# Patient Record
Sex: Female | Born: 1940 | Race: White | Hispanic: No | Smoking: Never smoker
Health system: Southern US, Community
[De-identification: ages and names within clinical notes are randomized; demographics above are authoritative.]

## PROBLEM LIST (undated history)

## (undated) DIAGNOSIS — E785 Hyperlipidemia, unspecified: Secondary | ICD-10-CM

## (undated) DIAGNOSIS — M797 Fibromyalgia: Secondary | ICD-10-CM

## (undated) DIAGNOSIS — M869 Osteomyelitis, unspecified: Secondary | ICD-10-CM

## (undated) DIAGNOSIS — I10 Essential (primary) hypertension: Secondary | ICD-10-CM

## (undated) DIAGNOSIS — M1990 Unspecified osteoarthritis, unspecified site: Secondary | ICD-10-CM

## (undated) DIAGNOSIS — K589 Irritable bowel syndrome without diarrhea: Secondary | ICD-10-CM

## (undated) DIAGNOSIS — M879 Osteonecrosis, unspecified: Secondary | ICD-10-CM

## (undated) DIAGNOSIS — M8580 Other specified disorders of bone density and structure, unspecified site: Secondary | ICD-10-CM

## (undated) HISTORY — DX: Osteomyelitis, unspecified: M86.9

## (undated) HISTORY — DX: Osteonecrosis, unspecified: M87.9

## (undated) HISTORY — DX: Other specified disorders of bone density and structure, unspecified site: M85.80

## (undated) HISTORY — DX: Unspecified osteoarthritis, unspecified site: M19.90

## (undated) HISTORY — DX: Essential (primary) hypertension: I10

## (undated) HISTORY — DX: Fibromyalgia: M79.7

## (undated) HISTORY — DX: Irritable bowel syndrome, unspecified: K58.9

## (undated) HISTORY — DX: Hyperlipidemia, unspecified: E78.5

---

## 1976-02-11 HISTORY — PX: TOTAL ABDOMINAL HYSTERECTOMY: SHX209

## 1977-02-10 HISTORY — PX: BILATERAL SALPINGOOPHORECTOMY: SHX1223

## 1987-02-11 HISTORY — PX: CARPAL TUNNEL RELEASE: SHX101

## 1998-11-21 ENCOUNTER — Other Ambulatory Visit: Admission: RE | Admit: 1998-11-21 | Discharge: 1998-11-21 | Payer: Self-pay | Admitting: *Deleted

## 2001-09-27 ENCOUNTER — Other Ambulatory Visit: Admission: RE | Admit: 2001-09-27 | Discharge: 2001-09-27 | Payer: Self-pay | Admitting: *Deleted

## 2002-08-07 ENCOUNTER — Encounter: Payer: Self-pay | Admitting: Internal Medicine

## 2002-08-07 ENCOUNTER — Encounter: Admission: RE | Admit: 2002-08-07 | Discharge: 2002-08-07 | Payer: Self-pay | Admitting: Internal Medicine

## 2003-12-27 ENCOUNTER — Ambulatory Visit: Payer: Self-pay | Admitting: Internal Medicine

## 2004-01-22 ENCOUNTER — Ambulatory Visit: Payer: Self-pay | Admitting: Family Medicine

## 2004-01-31 ENCOUNTER — Ambulatory Visit: Payer: Self-pay | Admitting: Internal Medicine

## 2004-04-02 ENCOUNTER — Ambulatory Visit: Payer: Self-pay | Admitting: Internal Medicine

## 2004-05-14 ENCOUNTER — Ambulatory Visit: Payer: Self-pay | Admitting: Internal Medicine

## 2004-06-13 ENCOUNTER — Ambulatory Visit: Payer: Self-pay | Admitting: Internal Medicine

## 2004-07-02 ENCOUNTER — Ambulatory Visit: Payer: Self-pay | Admitting: Internal Medicine

## 2004-08-20 ENCOUNTER — Ambulatory Visit: Payer: Self-pay | Admitting: Internal Medicine

## 2004-10-15 ENCOUNTER — Ambulatory Visit: Payer: Self-pay | Admitting: Internal Medicine

## 2004-11-02 ENCOUNTER — Emergency Department (HOSPITAL_COMMUNITY): Admission: EM | Admit: 2004-11-02 | Discharge: 2004-11-02 | Payer: Self-pay | Admitting: Emergency Medicine

## 2004-11-06 ENCOUNTER — Encounter: Admission: RE | Admit: 2004-11-06 | Discharge: 2004-11-06 | Payer: Self-pay | Admitting: Internal Medicine

## 2004-12-31 ENCOUNTER — Ambulatory Visit: Payer: Self-pay | Admitting: Internal Medicine

## 2005-02-17 ENCOUNTER — Ambulatory Visit: Payer: Self-pay | Admitting: Internal Medicine

## 2005-02-20 ENCOUNTER — Ambulatory Visit: Payer: Self-pay | Admitting: Internal Medicine

## 2005-03-04 ENCOUNTER — Ambulatory Visit: Payer: Self-pay | Admitting: Internal Medicine

## 2005-03-13 ENCOUNTER — Ambulatory Visit: Payer: Self-pay | Admitting: Internal Medicine

## 2005-04-04 ENCOUNTER — Ambulatory Visit: Payer: Self-pay | Admitting: Internal Medicine

## 2005-04-21 ENCOUNTER — Ambulatory Visit: Payer: Self-pay | Admitting: Internal Medicine

## 2005-06-02 ENCOUNTER — Ambulatory Visit: Payer: Self-pay | Admitting: Internal Medicine

## 2005-07-28 ENCOUNTER — Ambulatory Visit: Payer: Self-pay | Admitting: Internal Medicine

## 2005-07-29 ENCOUNTER — Ambulatory Visit: Payer: Self-pay | Admitting: Internal Medicine

## 2005-08-04 ENCOUNTER — Ambulatory Visit: Payer: Self-pay | Admitting: Internal Medicine

## 2005-10-28 ENCOUNTER — Encounter: Admission: RE | Admit: 2005-10-28 | Discharge: 2005-10-28 | Payer: Self-pay | Admitting: Specialist

## 2005-12-09 ENCOUNTER — Ambulatory Visit: Payer: Self-pay | Admitting: Internal Medicine

## 2005-12-09 LAB — CONVERTED CEMR LAB
Folate: 6.9 ng/mL
Vitamin B-12: 404 pg/mL (ref 211–911)

## 2006-03-09 ENCOUNTER — Ambulatory Visit: Payer: Self-pay | Admitting: Internal Medicine

## 2006-04-17 ENCOUNTER — Ambulatory Visit: Payer: Self-pay | Admitting: Internal Medicine

## 2006-06-01 ENCOUNTER — Ambulatory Visit: Payer: Self-pay | Admitting: Internal Medicine

## 2006-06-11 HISTORY — PX: REPLACEMENT TOTAL KNEE: SUR1224

## 2006-06-26 ENCOUNTER — Encounter: Payer: Self-pay | Admitting: Internal Medicine

## 2006-08-11 ENCOUNTER — Ambulatory Visit: Payer: Self-pay | Admitting: Internal Medicine

## 2006-09-15 DIAGNOSIS — I10 Essential (primary) hypertension: Secondary | ICD-10-CM

## 2006-09-15 HISTORY — DX: Essential (primary) hypertension: I10

## 2006-09-16 ENCOUNTER — Encounter: Payer: Self-pay | Admitting: Internal Medicine

## 2006-10-15 ENCOUNTER — Ambulatory Visit: Payer: Self-pay | Admitting: Internal Medicine

## 2006-10-15 DIAGNOSIS — M899 Disorder of bone, unspecified: Secondary | ICD-10-CM | POA: Insufficient documentation

## 2006-10-15 DIAGNOSIS — M1990 Unspecified osteoarthritis, unspecified site: Secondary | ICD-10-CM

## 2006-10-15 DIAGNOSIS — M949 Disorder of cartilage, unspecified: Secondary | ICD-10-CM

## 2006-10-15 DIAGNOSIS — M069 Rheumatoid arthritis, unspecified: Secondary | ICD-10-CM

## 2006-10-15 DIAGNOSIS — E785 Hyperlipidemia, unspecified: Secondary | ICD-10-CM

## 2006-10-15 DIAGNOSIS — I6789 Other cerebrovascular disease: Secondary | ICD-10-CM | POA: Insufficient documentation

## 2006-10-15 DIAGNOSIS — M059 Rheumatoid arthritis with rheumatoid factor, unspecified: Secondary | ICD-10-CM

## 2006-10-15 HISTORY — DX: Rheumatoid arthritis with rheumatoid factor, unspecified: M05.9

## 2006-10-15 HISTORY — DX: Other cerebrovascular disease: I67.89

## 2006-10-15 HISTORY — DX: Unspecified osteoarthritis, unspecified site: M19.90

## 2006-10-15 HISTORY — DX: Disorder of bone, unspecified: M89.9

## 2006-10-23 ENCOUNTER — Telehealth (INDEPENDENT_AMBULATORY_CARE_PROVIDER_SITE_OTHER): Payer: Self-pay | Admitting: *Deleted

## 2006-11-13 ENCOUNTER — Ambulatory Visit: Payer: Self-pay | Admitting: Internal Medicine

## 2006-11-13 DIAGNOSIS — K219 Gastro-esophageal reflux disease without esophagitis: Secondary | ICD-10-CM

## 2006-11-13 HISTORY — DX: Gastro-esophageal reflux disease without esophagitis: K21.9

## 2006-12-04 ENCOUNTER — Telehealth (INDEPENDENT_AMBULATORY_CARE_PROVIDER_SITE_OTHER): Payer: Self-pay | Admitting: *Deleted

## 2006-12-15 ENCOUNTER — Ambulatory Visit: Payer: Self-pay | Admitting: Internal Medicine

## 2006-12-15 LAB — CONVERTED CEMR LAB: Cholesterol, target level: 200 mg/dL

## 2006-12-17 ENCOUNTER — Telehealth: Payer: Self-pay | Admitting: Internal Medicine

## 2006-12-21 ENCOUNTER — Ambulatory Visit: Payer: Self-pay | Admitting: Internal Medicine

## 2006-12-21 LAB — CONVERTED CEMR LAB
LDL Cholesterol: 100 mg/dL — ABNORMAL HIGH (ref 0–99)
VLDL: 34 mg/dL (ref 0–40)

## 2007-01-21 ENCOUNTER — Ambulatory Visit: Payer: Self-pay | Admitting: Internal Medicine

## 2007-02-08 ENCOUNTER — Telehealth: Payer: Self-pay | Admitting: Internal Medicine

## 2007-02-17 ENCOUNTER — Encounter: Payer: Self-pay | Admitting: Internal Medicine

## 2007-02-25 ENCOUNTER — Ambulatory Visit: Payer: Self-pay | Admitting: Internal Medicine

## 2007-02-26 ENCOUNTER — Telehealth: Payer: Self-pay | Admitting: Internal Medicine

## 2007-03-10 ENCOUNTER — Encounter: Payer: Self-pay | Admitting: Internal Medicine

## 2007-05-13 ENCOUNTER — Ambulatory Visit: Payer: Self-pay | Admitting: Internal Medicine

## 2007-06-11 ENCOUNTER — Encounter: Payer: Self-pay | Admitting: Internal Medicine

## 2007-07-21 ENCOUNTER — Ambulatory Visit: Payer: Self-pay | Admitting: Internal Medicine

## 2007-07-21 LAB — CONVERTED CEMR LAB
AST: 24 units/L (ref 0–37)
Albumin: 4.2 g/dL (ref 3.5–5.2)
Alkaline Phosphatase: 59 units/L (ref 39–117)
Cholesterol: 176 mg/dL (ref 0–200)
Total Protein: 7.2 g/dL (ref 6.0–8.3)
Triglycerides: 174 mg/dL — ABNORMAL HIGH (ref 0–149)

## 2007-09-06 ENCOUNTER — Encounter: Admission: RE | Admit: 2007-09-06 | Discharge: 2007-09-06 | Payer: Self-pay | Admitting: Neurology

## 2007-09-16 ENCOUNTER — Encounter: Payer: Self-pay | Admitting: Internal Medicine

## 2007-10-12 ENCOUNTER — Ambulatory Visit: Payer: Self-pay | Admitting: Internal Medicine

## 2007-11-18 ENCOUNTER — Encounter: Payer: Self-pay | Admitting: Internal Medicine

## 2007-11-19 ENCOUNTER — Ambulatory Visit: Payer: Self-pay | Admitting: Internal Medicine

## 2007-11-19 LAB — CONVERTED CEMR LAB: VLDL: 59 mg/dL — ABNORMAL HIGH (ref 0–40)

## 2007-12-01 LAB — CONVERTED CEMR LAB: Vit D, 1,25-Dihydroxy: 21 — ABNORMAL LOW (ref 30–89)

## 2007-12-13 ENCOUNTER — Telehealth: Payer: Self-pay | Admitting: *Deleted

## 2008-02-16 ENCOUNTER — Ambulatory Visit: Payer: Self-pay | Admitting: Internal Medicine

## 2008-03-03 ENCOUNTER — Telehealth: Payer: Self-pay | Admitting: Internal Medicine

## 2008-03-14 ENCOUNTER — Ambulatory Visit: Payer: Self-pay | Admitting: Internal Medicine

## 2008-03-24 LAB — CONVERTED CEMR LAB: Vit D, 1,25-Dihydroxy: 24 — ABNORMAL LOW (ref 30–89)

## 2008-05-15 ENCOUNTER — Ambulatory Visit: Payer: Self-pay | Admitting: Internal Medicine

## 2008-05-15 DIAGNOSIS — R252 Cramp and spasm: Secondary | ICD-10-CM

## 2008-05-15 DIAGNOSIS — E162 Hypoglycemia, unspecified: Secondary | ICD-10-CM

## 2008-05-15 HISTORY — DX: Cramp and spasm: R25.2

## 2008-05-15 HISTORY — DX: Hypoglycemia, unspecified: E16.2

## 2008-05-15 LAB — CONVERTED CEMR LAB
Albumin: 3.8 g/dL (ref 3.5–5.2)
Alkaline Phosphatase: 73 units/L (ref 39–117)
Bilirubin, Direct: 0.1 mg/dL (ref 0.0–0.3)
CO2: 28 meq/L (ref 19–32)
Chloride: 103 meq/L (ref 96–112)
Creatinine, Ser: 1 mg/dL (ref 0.4–1.2)
Magnesium: 2.5 mg/dL (ref 1.5–2.5)
Potassium: 3.9 meq/L (ref 3.5–5.1)

## 2008-05-16 ENCOUNTER — Telehealth: Payer: Self-pay | Admitting: Internal Medicine

## 2008-06-05 ENCOUNTER — Telehealth: Payer: Self-pay | Admitting: Internal Medicine

## 2008-06-09 ENCOUNTER — Ambulatory Visit: Payer: Self-pay | Admitting: Internal Medicine

## 2008-06-09 DIAGNOSIS — J301 Allergic rhinitis due to pollen: Secondary | ICD-10-CM

## 2008-06-09 HISTORY — DX: Allergic rhinitis due to pollen: J30.1

## 2008-07-12 ENCOUNTER — Encounter: Payer: Self-pay | Admitting: Internal Medicine

## 2008-07-18 LAB — HM MAMMOGRAPHY

## 2008-09-25 ENCOUNTER — Ambulatory Visit: Payer: Self-pay | Admitting: Internal Medicine

## 2008-09-25 DIAGNOSIS — J0100 Acute maxillary sinusitis, unspecified: Secondary | ICD-10-CM

## 2008-09-25 LAB — CONVERTED CEMR LAB
BUN: 15 mg/dL (ref 6–23)
Calcium: 9.3 mg/dL (ref 8.4–10.5)
Creatinine, Ser: 1.1 mg/dL (ref 0.4–1.2)
Direct LDL: 187.9 mg/dL
GFR calc non Af Amer: 52.56 mL/min (ref >60)
HDL: 47.4 mg/dL (ref >39.00)
Hgb A1c MFr Bld: 6.1 % (ref 4.6–6.5)
Potassium: 3.8 meq/L (ref 3.5–5.1)
TSH: 2.17 microintl units/mL (ref 0.35–5.50)

## 2008-09-26 ENCOUNTER — Encounter: Payer: Self-pay | Admitting: Internal Medicine

## 2008-10-10 ENCOUNTER — Telehealth: Payer: Self-pay | Admitting: Internal Medicine

## 2008-10-17 ENCOUNTER — Encounter: Payer: Self-pay | Admitting: Internal Medicine

## 2008-11-08 ENCOUNTER — Telehealth: Payer: Self-pay | Admitting: Internal Medicine

## 2008-11-09 ENCOUNTER — Ambulatory Visit: Payer: Self-pay | Admitting: Internal Medicine

## 2008-11-09 DIAGNOSIS — T7840XA Allergy, unspecified, initial encounter: Secondary | ICD-10-CM

## 2008-11-09 HISTORY — DX: Allergy, unspecified, initial encounter: T78.40XA

## 2008-11-09 LAB — CONVERTED CEMR LAB
BUN: 28 mg/dL — ABNORMAL HIGH (ref 6–23)
Basophils Relative: 0.5 % (ref 0.0–3.0)
Calcium: 9.9 mg/dL (ref 8.4–10.5)
Cholesterol: 260 mg/dL — ABNORMAL HIGH (ref 0–200)
Creatinine, Ser: 1.3 mg/dL — ABNORMAL HIGH (ref 0.4–1.2)
Direct LDL: 175.1 mg/dL
Eosinophils Relative: 4.9 % (ref 0.0–5.0)
GFR calc non Af Amer: 43.33 mL/min (ref >60)
HDL: 52.5 mg/dL (ref >39.00)
Lymphocytes Relative: 25.3 % (ref 12.0–46.0)
Neutrophils Relative %: 57.9 % (ref 43.0–77.0)
Platelets: 361 10*3/uL (ref 150.0–400.0)
RBC: 4.55 M/uL (ref 3.87–5.11)
WBC: 7.5 10*3/uL (ref 4.5–10.5)

## 2008-12-04 ENCOUNTER — Telehealth: Payer: Self-pay | Admitting: Internal Medicine

## 2009-01-07 ENCOUNTER — Telehealth: Payer: Self-pay | Admitting: Internal Medicine

## 2009-01-08 ENCOUNTER — Telehealth: Payer: Self-pay | Admitting: Internal Medicine

## 2009-01-16 ENCOUNTER — Ambulatory Visit: Payer: Self-pay | Admitting: Internal Medicine

## 2009-01-16 LAB — CONVERTED CEMR LAB
Albumin: 3.9 g/dL (ref 3.5–5.2)
Alkaline Phosphatase: 49 units/L (ref 39–117)
Bilirubin, Direct: 0 mg/dL (ref 0.0–0.3)
Total Protein: 6.8 g/dL (ref 6.0–8.3)

## 2009-01-17 ENCOUNTER — Encounter: Payer: Self-pay | Admitting: Internal Medicine

## 2009-01-26 LAB — CONVERTED CEMR LAB: Vit D, 25-Hydroxy: 20 ng/mL — ABNORMAL LOW (ref 30–89)

## 2009-03-30 ENCOUNTER — Telehealth: Payer: Self-pay | Admitting: Internal Medicine

## 2009-04-03 ENCOUNTER — Ambulatory Visit: Payer: Self-pay | Admitting: Internal Medicine

## 2009-04-03 DIAGNOSIS — H3400 Transient retinal artery occlusion, unspecified eye: Secondary | ICD-10-CM | POA: Insufficient documentation

## 2009-04-03 LAB — CONVERTED CEMR LAB
Basophils Absolute: 0 10*3/uL (ref 0.0–0.1)
CRP, High Sensitivity: 8.9 — ABNORMAL HIGH (ref 0.00–5.00)
Eosinophils Relative: 9.9 % — ABNORMAL HIGH (ref 0.0–5.0)
Hemoglobin: 11.2 g/dL — ABNORMAL LOW (ref 12.0–15.0)
Lymphocytes Relative: 18.3 % (ref 12.0–46.0)
Monocytes Relative: 7.8 % (ref 3.0–12.0)
Platelets: 402 10*3/uL — ABNORMAL HIGH (ref 150.0–400.0)
RDW: 13.7 % (ref 11.5–14.6)
WBC: 7.5 10*3/uL (ref 4.5–10.5)

## 2009-04-05 ENCOUNTER — Telehealth: Payer: Self-pay | Admitting: Internal Medicine

## 2009-04-23 ENCOUNTER — Encounter: Payer: Self-pay | Admitting: Internal Medicine

## 2009-04-23 ENCOUNTER — Ambulatory Visit: Payer: Self-pay | Admitting: Cardiology

## 2009-04-23 ENCOUNTER — Ambulatory Visit: Payer: Self-pay

## 2009-04-25 ENCOUNTER — Ambulatory Visit: Payer: Self-pay | Admitting: Internal Medicine

## 2009-04-25 LAB — CONVERTED CEMR LAB
ALT: 15 units/L (ref 0–35)
AST: 22 units/L (ref 0–37)
Albumin: 3.7 g/dL (ref 3.5–5.2)
Cholesterol: 228 mg/dL — ABNORMAL HIGH (ref 0–200)
Direct LDL: 137.6 mg/dL
Total Protein: 6.7 g/dL (ref 6.0–8.3)
Triglycerides: 236 mg/dL — ABNORMAL HIGH (ref 0.0–149.0)

## 2009-05-02 ENCOUNTER — Ambulatory Visit: Payer: Self-pay | Admitting: Internal Medicine

## 2009-05-02 DIAGNOSIS — N3010 Interstitial cystitis (chronic) without hematuria: Secondary | ICD-10-CM

## 2009-05-02 HISTORY — DX: Interstitial cystitis (chronic) without hematuria: N30.10

## 2009-06-12 HISTORY — PX: ESOPHAGOGASTRODUODENOSCOPY ENDOSCOPY: SHX5814

## 2009-06-12 HISTORY — PX: COLONOSCOPY W/ BIOPSIES: SHX1374

## 2009-06-20 ENCOUNTER — Ambulatory Visit: Payer: Self-pay | Admitting: Internal Medicine

## 2009-06-20 LAB — CONVERTED CEMR LAB
ALT: 16 units/L (ref 0–35)
AST: 26 units/L (ref 0–37)
Alkaline Phosphatase: 45 units/L (ref 39–117)
Basophils Absolute: 0 10*3/uL (ref 0.0–0.1)
Bilirubin, Direct: 0 mg/dL (ref 0.0–0.3)
Eosinophils Absolute: 0.5 10*3/uL (ref 0.0–0.7)
Hemoglobin: 10.5 g/dL — ABNORMAL LOW (ref 12.0–15.0)
Lymphocytes Relative: 33.1 % (ref 12.0–46.0)
MCHC: 33.3 g/dL (ref 30.0–36.0)
Neutro Abs: 2.8 10*3/uL (ref 1.4–7.7)
RDW: 16.5 % — ABNORMAL HIGH (ref 11.5–14.6)
Total Protein: 6.8 g/dL (ref 6.0–8.3)

## 2009-06-22 ENCOUNTER — Telehealth: Payer: Self-pay | Admitting: Internal Medicine

## 2009-07-25 ENCOUNTER — Ambulatory Visit: Payer: Self-pay | Admitting: Internal Medicine

## 2009-07-25 DIAGNOSIS — D131 Benign neoplasm of stomach: Secondary | ICD-10-CM

## 2009-07-25 HISTORY — DX: Benign neoplasm of stomach: D13.1

## 2009-08-14 HISTORY — PX: ESOPHAGOGASTRODUODENOSCOPY ENDOSCOPY: SHX5814

## 2009-08-31 ENCOUNTER — Telehealth: Payer: Self-pay | Admitting: *Deleted

## 2009-08-31 DIAGNOSIS — K602 Anal fissure, unspecified: Secondary | ICD-10-CM | POA: Insufficient documentation

## 2009-08-31 HISTORY — DX: Anal fissure, unspecified: K60.2

## 2009-09-20 ENCOUNTER — Ambulatory Visit: Payer: Self-pay | Admitting: Internal Medicine

## 2009-09-20 ENCOUNTER — Telehealth: Payer: Self-pay | Admitting: Internal Medicine

## 2009-09-20 LAB — CONVERTED CEMR LAB
Direct LDL: 141.8 mg/dL
Triglycerides: 218 mg/dL — ABNORMAL HIGH (ref 0.0–149.0)

## 2009-10-18 ENCOUNTER — Encounter: Payer: Self-pay | Admitting: Internal Medicine

## 2009-10-30 ENCOUNTER — Ambulatory Visit: Payer: Self-pay | Admitting: Internal Medicine

## 2009-10-30 DIAGNOSIS — D539 Nutritional anemia, unspecified: Secondary | ICD-10-CM

## 2009-10-30 HISTORY — DX: Nutritional anemia, unspecified: D53.9

## 2009-10-30 LAB — CONVERTED CEMR LAB
Basophils Relative: 0.8 % (ref 0.0–3.0)
CO2: 29 meq/L (ref 19–32)
Chloride: 102 meq/L (ref 96–112)
Creatinine, Ser: 1.1 mg/dL (ref 0.4–1.2)
Eosinophils Absolute: 0.3 10*3/uL (ref 0.0–0.7)
HCT: 35.2 % — ABNORMAL LOW (ref 36.0–46.0)
Hemoglobin: 11.6 g/dL — ABNORMAL LOW (ref 12.0–15.0)
Lymphs Abs: 2.1 10*3/uL (ref 0.7–4.0)
MCHC: 33.1 g/dL (ref 30.0–36.0)
MCV: 76.6 fL — ABNORMAL LOW (ref 78.0–100.0)
Monocytes Absolute: 0.9 10*3/uL (ref 0.1–1.0)
Neutro Abs: 3.9 10*3/uL (ref 1.4–7.7)
RBC: 4.59 M/uL (ref 3.87–5.11)
Sodium: 139 meq/L (ref 135–145)

## 2009-11-13 ENCOUNTER — Telehealth: Payer: Self-pay | Admitting: *Deleted

## 2010-01-23 ENCOUNTER — Ambulatory Visit: Payer: Self-pay | Admitting: Internal Medicine

## 2010-01-23 LAB — CONVERTED CEMR LAB
ALT: 15 units/L (ref 0–35)
AST: 26 units/L (ref 0–37)
Alkaline Phosphatase: 50 units/L (ref 39–117)
Bilirubin, Direct: 0.1 mg/dL (ref 0.0–0.3)
Direct LDL: 165.4 mg/dL
Total Bilirubin: 0.5 mg/dL (ref 0.3–1.2)
Total CHOL/HDL Ratio: 5
Total Protein: 6.8 g/dL (ref 6.0–8.3)

## 2010-01-30 ENCOUNTER — Ambulatory Visit (INDEPENDENT_AMBULATORY_CARE_PROVIDER_SITE_OTHER): Payer: Medicare Other | Admitting: Internal Medicine

## 2010-03-01 ENCOUNTER — Encounter: Payer: Self-pay | Admitting: Internal Medicine

## 2010-03-07 ENCOUNTER — Encounter: Payer: Self-pay | Admitting: Internal Medicine

## 2010-03-07 ENCOUNTER — Other Ambulatory Visit: Payer: Self-pay | Admitting: Internal Medicine

## 2010-03-07 DIAGNOSIS — K219 Gastro-esophageal reflux disease without esophagitis: Secondary | ICD-10-CM

## 2010-03-07 DIAGNOSIS — IMO0001 Reserved for inherently not codable concepts without codable children: Secondary | ICD-10-CM

## 2010-03-07 DIAGNOSIS — I10 Essential (primary) hypertension: Secondary | ICD-10-CM

## 2010-03-07 DIAGNOSIS — M069 Rheumatoid arthritis, unspecified: Secondary | ICD-10-CM

## 2010-03-07 LAB — HIGH SENSITIVITY CRP: CRP, High Sensitivity: 5.69 mg/L — ABNORMAL HIGH (ref 0.00–5.00)

## 2010-03-07 LAB — CONVERTED CEMR LAB: Cyclic Citrullin Peptide Ab: <2 units (ref 0.0–5.0)

## 2010-03-13 ENCOUNTER — Other Ambulatory Visit: Payer: Self-pay | Admitting: Internal Medicine

## 2010-03-13 DIAGNOSIS — M069 Rheumatoid arthritis, unspecified: Secondary | ICD-10-CM

## 2010-03-14 NOTE — Progress Notes (Signed)
Summary: Henrene Dodge RESULTS  Phone Note Call from Patient Call back at [phone]   Caller: Patient [phone] Reason for Call: Talk to Nurse, Lab or Test Results Summary of Call: Pt called in to obtain results from recent labwork...Marland KitchenMarland Kitchen Pt adv that she can be reached at home till 11am [phone] .... after 11am she will have to leave home and can be reached on her cell # [phone].  Initial call taken by: Debbra Riding,  Jun 22, 2009 10:29 AM  Follow-up for Phone Call        left message on machine lllllipids elevated an d he will discuss at ov-but slight anemic- try otc iron until seen in june Follow-up by: Willy Eddy, LPN,  Jun 22, 2009 4:18 PM

## 2010-03-14 NOTE — Assessment & Plan Note (Signed)
Summary: possible tia/bmw   Vital Signs:  Patient profile:   70 year old female Height:      67 inches Weight:      176 pounds BMI:     27.67 Temp:     98.6 degrees F rectal Pulse rate:   76 / minute Resp:     14 per minute BP sitting:   132 / 70  (left arm)  Vitals Entered By: Willy Eddy, LPN (April 03, 2009 4:29 PM) CC: was sent to [facility] by opthamologist for labs   CC:  was sent to [facility] by opthamologist for labs.  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 Has  a prior MRI at Dr Alena Bills office which showed TIA's per the pt but has carotids then that were "clear" risks are HTN and lipids and well as PMHx  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) 11)  Osteoarthritis  (ICD-715.90) 12)  Fibromyalgia  (ICD-729.1) 13)  Hypertension  (ICD-401.9)  Current Problems (verified): 1)  Allergy Unspecified Not Elsewhere Classified  (ICD-995.3) 2)  Acute Maxillary Sinusitis  (ICD-461.0) 3)  Allergic Rhinitis Due To Pollen  (ICD-477.0) 4)  Hypoglycemia, Unspecified  (ICD-251.2) 5)  Muscle Cramps  (ICD-729.82) 6)  Gerd, Severe  (ICD-530.81) 7)  Osteopenia  (ICD-733.90) 8)  Hyperlipidemia  (ICD-272.4) 9)  Insufficiency, 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 1 Once Daily 2)  Darvocet-N 100 100-650 Mg  Tabs (Propoxyphene N-Apap) .... As Needed 3)  Vitamin D 16109 Unit  Caps (Ergocalciferol) .Marland Kitchen.. 1 Two Times A Week 4)  Valtrex 500 Mg  Tabs (Valacyclovir Hcl) .Marland Kitchen.. 1 Three Times A Day As Needed 5)  Lisinopril-Hydrochlorothiazide 20-25 Mg  Tabs (Lisinopril-Hydrochlorothiazide) .... 1/2 By Mouth Two Times A Day 6)  Pantoprazole Sodium 40 Mg Tbec (Pantoprazole Sodium) .... One By Mouth Bid 7)  Krill Oil 1000 Mg Caps (Krill Oil) .... Two By Mouth Two Times A Day 8)  Fexofenadine-Pseudoephedrine 60-120 Mg Xr12h-Tab (Fexofenadine-Pseudoephedrine) .... One By Mouth Two Times A Day Prn 9)  Mobic 7.5 Mg Tabs (Meloxicam) .... One By Mouth Daily 10)  Xyzal 5 Mg Tabs (Levocetirizine Dihydrochloride) .Marland Kitchen.. 1 Once Daily 11)  Zithromax Z-Pak 250 Mg Tabs (Azithromycin) .... Use As Directed 12)  Livalo 2 Mg Tabs (Pitavastatin Calcium) .... One By Mouth Daily  Current Medications (verified): 1)  Celexa 20 Mg Tabs (Citalopram Hydrobromide) .Marland Kitchen.. 1 Once Daily 2)  Vitamin D 60454 Unit  Caps (Ergocalciferol) .Marland Kitchen.. 1 Two Times A Week 3)  Valtrex 500 Mg  Tabs (Valacyclovir Hcl) .Marland Kitchen.. 1 Three Times A Day As Needed 4)  Lisinopril-Hydrochlorothiazide 20-25 Mg  Tabs (Lisinopril-Hydrochlorothiazide) .... 1/2 By Mouth Two Times A Day 5)  Pantoprazole Sodium 40 Mg  Tbec (Pantoprazole Sodium) .... One By Mouth Bid 6)  Krill Oil 1000 Mg Caps (Krill Oil) .... Two By Mouth Two Times A Day 7)  Fexofenadine-Pseudoephedrine 60-120 Mg Xr12h-Tab (Fexofenadine-Pseudoephedrine) .... One By Mouth Two Times A Day Prn 8)  Mobic 7.5 Mg Tabs (Meloxicam) .... One By Mouth Daily 9)  Xyzal 5 Mg Tabs (Levocetirizine Dihydrochloride) .Marland Kitchen.. 1 Once Daily 10)  Zithromax Z-Pak 250 Mg Tabs (Azithromycin) .... Use As Directed 11)  Livalo 2 Mg Tabs (Pitavastatin Calcium) .... One By Mouth  Daily  Allergies (verified): 1)  ! * Ivp Dye 2)  ! Penicillin 3)  ! Sulfa  Past History:  Social History: Last updated: 10/15/2006 Married Never Smoked Alcohol use-no Drug use-no Regular exercise-no  Risk Factors: Exercise: no (10/15/2006)  Risk Factors: Smoking Status: never (04/03/2009)  Past medical, surgical, family and social histories (including risk factors) reviewed, and no changes noted (except as noted below).  Past Medical History: Reviewed history from 10/15/2006 and no changes required. Hypertension IBS Fibromyalgia Osteoarthritis steriod induced necrosis of knee Hyperlipidemia Osteopenia  Past Surgical History: Reviewed history from 10/15/2006 and no changes required. 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  Social History: Reviewed history from 10/15/2006 and no changes required. Married Never Smoked Alcohol use-no Drug use-no Regular exercise-no  Review of Systems  The patient denies anorexia, fever, weight loss, weight gain, vision loss, decreased hearing, hoarseness, chest pain, syncope, dyspnea on exertion, peripheral edema, prolonged cough, headaches, hemoptysis, abdominal pain, melena, hematochezia, severe indigestion/heartburn, hematuria, incontinence, genital sores, muscle weakness, suspicious skin lesions, transient blindness, difficulty walking, depression, unusual weight change, abnormal bleeding, enlarged lymph nodes, angioedema, and breast masses.    Physical Exam  General:  Well-developed,well-nourished,in no acute distress; alert,appropriate and cooperative throughout examination Head:  Normocephalic and atraumatic without obvious abnormalities. No apparent alopecia or balding. Ears:  R ear normal and L ear normal.   Nose:  no external deformity and no nasal discharge.   Mouth:  Oral mucosa and oropharynx without lesions or exudates.  Teeth in good repair. Neck:  No  deformities, masses, or tenderness noted. Lungs:  normal respiratory effort and no wheezes.   Heart:  normal rate and regular rhythm.   Abdomen:  Bowel sounds positive,abdomen soft and non-tender without masses, organomegaly or hernias noted. Neurologic:  alert & oriented X3, strength normal in all extremities, gait normal, and DTRs symmetrical and normal.     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 One by mouth daily  Labs Reviewed: SGOT: 25 (01/16/2009)   SGPT: 14 (01/16/2009)  Lipid Goals: Chol Goal: 200 (12/15/2006)   HDL Goal: 40 (12/15/2006)   LDL Goal: 100 (12/15/2006)   TG Goal: 150 (12/15/2006)  Prior 10 Yr Risk Heart Disease: Not enough information (06/09/2008)   HDL:52.50 (11/09/2008), 47.40 (09/25/2008)  LDL:DEL (11/19/2007), 92 (16/11/9602)  Chol:260 (11/09/2008), 265 (09/25/2008)  Trig:295 (11/19/2007), 174 (07/21/2007)  Problem # 3:  HYPERTENSION (ICD-401.9) well controlled Her updated medication list for this problem includes:    Lisinopril-hydrochlorothiazide 20-25  Mg Tabs (Lisinopril-hydrochlorothiazide) .Marland Kitchen... 1/2 by mouth two times a day  BP today: 132/70 Prior BP: 130/80 (01/16/2009)  Prior 10 Yr Risk Heart Disease: Not enough information (06/09/2008)  Labs Reviewed: K+: 4.1 (11/09/2008) Creat: : 1.3 (11/09/2008)   Chol: 260 (11/09/2008)   HDL: 52.50 (11/09/2008)   LDL: DEL (11/19/2007)   TG: 295 (11/19/2007)  Complete Medication  List: 1)  Celexa 20 Mg Tabs (Citalopram hydrobromide) .Marland Kitchen.. 1 once daily 2)  Vitamin D 45409 Unit Caps (Ergocalciferol) .Marland Kitchen.. 1 two times a week 3)  Valtrex 500 Mg Tabs (Valacyclovir hcl) .Marland Kitchen.. 1 three times a day as needed 4)  Lisinopril-hydrochlorothiazide 20-25 Mg Tabs (Lisinopril-hydrochlorothiazide) .... 1/2 by mouth two times a day 5)  Pantoprazole Sodium 40 Mg Tbec (Pantoprazole sodium) .... One by mouth bid 6)  Krill Oil 1000 Mg Caps (Krill oil) .... Two by mouth two times a day 7)  Fexofenadine-pseudoephedrine 60-120 Mg Xr12h-tab (Fexofenadine-pseudoephedrine) .... One by mouth two times a day prn 8)  Mobic 7.5 Mg Tabs (Meloxicam) .... One by mouth daily 9)  Xyzal 5 Mg Tabs (Levocetirizine dihydrochloride) .Marland Kitchen.. 1 once daily 10)  Zithromax Z-pak 250 Mg Tabs (Azithromycin) .... Use as directed 11)  Livalo 2 Mg Tabs (Pitavastatin calcium) .... One by mouth daily  Other Orders: Prescription Created Electronically [phone])  Patient Instructions: 1)  Please schedule an appointment with your primary doctor as schedule Prescriptions: FEXOFENADINE-PSEUDOEPHEDRINE 60-120 MG XR12H-TAB (FEXOFENADINE-PSEUDOEPHEDRINE) one by mouth two times a day prn  #30 x 6   Entered by:   Willy Eddy, LPN   Authorized by:   [name] MD   Signed by:   Willy Eddy, LPN on 47/82/9562   Method used:   Electronically to        [facility] [phone]* (retail)       [address]       [place], [place]  [address]       Ph: [phone]       Fax: [phone]   RxID:   [id number]

## 2010-03-14 NOTE — Assessment & Plan Note (Signed)
Summary: 2 month rov/njr---PT [facility] // RS   Vital Signs:  Patient profile:   70 year old female Height:      67 inches Weight:      178 pounds BMI:     27.98 Temp:     98.2 degrees F oral Pulse rate:   72 / minute Resp:     14 per minute BP sitting:   110 / 70  (left arm)  Vitals Entered By: Willy Eddy, LPN (July 25, 2009 11:32 AM) CC: roa- labs in may Comments has not been taking trilipix- thought it was her husband's med   CC:  roa- labs in may.  History of Present Illness: she did not take the trilipix   but did take the livalo has had  severe cramps in rib cage and abd wall since started the livalo she hasHTN and GERD as well as fibromyalgia she went to GI and had a colon and endo with [place] and no stones or leson had a small intestine camera ( no leson only finding polyps in the stomach ( the bx was "slight change of cancer" planned repeat EGD at [facility]  ( dr Nedra Hai) iron was started I have spent greater that 45 min face to face evaluating this patient and over 1/2 of this time was in councilling we when over up to date's information about polyps wth husband and pt  Preventive Screening-Counseling & Management  Alcohol-Tobacco     Smoking Status: never  Problems Prior to Update: 1)  Interstitial Cystitis  (ICD-595.1) 2)  Amaurosis Fugax  (ICD-362.34) 3)  Allergy Unspecified Not Elsewhere Classified  (ICD-995.3) 4)  Acute Maxillary Sinusitis  (ICD-461.0) 5)  Allergic Rhinitis Due To Pollen  (ICD-477.0) 6)  Hypoglycemia, Unspecified  (ICD-251.2) 7)  Muscle Cramps  (ICD-729.82) 8)  Gerd, Severe  (ICD-530.81) 9)  Osteopenia  (ICD-733.90) 10)  Hyperlipidemia  (ICD-272.4) 11)  Insufficiency, 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) 12)  Osteoarthritis  (ICD-715.90) 13)  Fibromyalgia  (ICD-729.1) 14)  Hypertension  (ICD-401.9)  Current Problems (verified): 1)  Interstitial Cystitis  (ICD-595.1) 2)  Amaurosis Fugax  (ICD-362.34) 3)  Allergy Unspecified Not  Elsewhere Classified  (ICD-995.3) 4)  Acute Maxillary Sinusitis  (ICD-461.0) 5)  Allergic Rhinitis Due To Pollen  (ICD-477.0) 6)  Hypoglycemia, Unspecified  (ICD-251.2) 7)  Muscle Cramps  (ICD-729.82) 8)  Gerd, Severe  (ICD-530.81) 9)  Osteopenia  (ICD-733.90) 10)  Hyperlipidemia  (ICD-272.4) 11)  Insufficiency, 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 1 Once Daily 2)  Vitamin D 40347 Unit  Caps (Ergocalciferol) .Marland Kitchen.. 1 A Week 3)  Valtrex 500 Mg  Tabs (Valacyclovir Hcl) .Marland Kitchen.. 1 Three Times A Day As Needed 4)  Lisinopril-Hydrochlorothiazide 20-25 Mg  Tabs (Lisinopril-Hydrochlorothiazide) .... 1/2 By Mouth Two Times A Day 5)  Pantoprazole Sodium 40 Mg Tbec (Pantoprazole Sodium) .... One By Mouth Bid 6)  Krill Oil 1000 Mg Caps (Krill Oil) .... Two By Mouth Two Times A Day 7)  Fexofenadine-Pseudoephedrine 60-120 Mg Xr12h-Tab (Fexofenadine-Pseudoephedrine) .... One By Mouth Two Times A Day Prn 8)  Mobic 7.5 Mg Tabs (Meloxicam) .... One By Mouth Daily 9)  Xyzal 5 Mg Tabs (Levocetirizine Dihydrochloride) .Marland Kitchen.. 1 Once Daily 10)  Livalo 2 Mg Tabs (Pitavastatin Calcium) .... One By Mouth  Twice A Week 11)  Trilipix 135 Mg Cpdr (Choline Fenofibrate) .Marland KitchenMarland KitchenMarland Kitchen  One By Mouth Twice A Week With The Livalo 12)  Hyoscyamine Sulfate 0.125 Mg Tabs (Hyoscyamine Sulfate) .... One By Mouth Every 4 Hours For Bladder Spasms  Current Medications (verified): 1)  Celexa 20 Mg Tabs (Citalopram Hydrobromide) .Marland Kitchen.. 1 Once Daily 2)  Vitamin D 16109 Unit  Caps (Ergocalciferol) .Marland Kitchen.. 1 A Week 3)  Valtrex 500 Mg  Tabs (Valacyclovir Hcl) .Marland Kitchen.. 1 Three Times A Day As Needed 4)  Lisinopril-Hydrochlorothiazide 20-25 Mg  Tabs (Lisinopril-Hydrochlorothiazide) .... 1/2 By Mouth Two Times A Day 5)  Pantoprazole Sodium 40 Mg Tbec (Pantoprazole Sodium) .... One By  Mouth Bid 6)  Krill Oil 1000 Mg Caps (Krill Oil) .... Two By Mouth Two Times A Day 7)  Fexofenadine-Pseudoephedrine 60-120 Mg Xr12h-Tab (Fexofenadine-Pseudoephedrine) .... One By Mouth Two Times A Day Prn 8)  Mobic 7.5 Mg Tabs (Meloxicam) .... One By Mouth Daily 9)  Xyzal 5 Mg Tabs (Levocetirizine Dihydrochloride) .Marland Kitchen.. 1 Once Daily 10)  Trilipix 135 Mg Cpdr (Choline Fenofibrate) .... One By Mouth Twice A Week With The Livalo 11)  Hyoscyamine Sulfate 0.125 Mg Tabs (Hyoscyamine Sulfate) .... One By Mouth Every 4 Hours For Bladder Spasms 12)  Ferrous Sulfate 325 (65 Fe) Mg Tabs (Ferrous Sulfate) .... One By Mouth Daily  Allergies (verified): 1)  ! * Ivp Dye 2)  ! Penicillin 3)  ! Sulfa  Past History:  Social History: Last updated: 10/15/2006 Married Never Smoked Alcohol use-no Drug use-no Regular exercise-no  Risk Factors: Exercise: no (10/15/2006)  Risk Factors: Smoking Status: never (07/25/2009)  Past medical, surgical, family and social histories (including risk factors) reviewed, and no changes noted (except as noted below).  Past Medical History: Reviewed history from 10/15/2006 and no changes required. Hypertension IBS Fibromyalgia Osteoarthritis steriod induced necrosis of knee Hyperlipidemia Osteopenia  Past Surgical History: Reviewed history from 10/15/2006 and no changes required. 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  Social History: Reviewed history from 10/15/2006 and no changes required. Married Never Smoked Alcohol use-no Drug use-no Regular exercise-no  Review of Systems  The patient denies anorexia, fever, weight loss, weight gain, vision loss, decreased hearing, hoarseness, chest pain, syncope, dyspnea on exertion, peripheral edema, prolonged cough, headaches, hemoptysis, abdominal pain, melena, hematochezia, severe indigestion/heartburn, hematuria, incontinence, genital sores, muscle  weakness, suspicious skin lesions, transient blindness, difficulty walking, depression, unusual weight change, abnormal bleeding, enlarged lymph nodes, angioedema, and breast masses.    Physical Exam  General:  Well-developed,well-nourished,in no acute distress; alert,appropriate and cooperative throughout examination Head:  Normocephalic and atraumatic without obvious abnormalities. No apparent alopecia or balding. Eyes:  no nystagmus Ears:  R ear normal and L ear normal.   Nose:  no external deformity and no nasal discharge.   Mouth:  Oral mucosa and oropharynx without lesions or exudates.  Teeth in good repair. Lungs:  normal respiratory effort and no wheezes.   Heart:  normal rate and regular rhythm.   Abdomen:  Bowel sounds positive,abdomen soft and non-tender without masses, organomegaly or hernias noted.   Impression & Recommendations:  Problem # 1:  GASTRIC POLYP (ICD-211.1) Assessment New there is not information on this I have given the pt informations of h pylori and agastric polyp and she will follow up with GI we need copies of the reports carefull and complete discussion of optins and questins to sk the consultant as well as review od the information the pt has  Problem # 2:  INTERSTITIAL CYSTITIS (ICD-595.1) stable  Problem #  3:  HYPERLIPIDEMIA (ICD-272.4)  stop the livalo  The following medications were removed from the medication list:    Livalo 2 Mg Tabs (Pitavastatin calcium) ..... One by mouth  twice a week Her updated medication list for this problem includes:    Trilipix 135 Mg Cpdr (Choline fenofibrate) ..... One by mouth twice a week with the livalo restart the trilipix  Labs Reviewed: SGOT: 26 (06/20/2009)   SGPT: 16 (06/20/2009)  Lipid Goals: Chol Goal: 200 (12/15/2006)   HDL Goal: 40 (12/15/2006)   LDL Goal: 100 (12/15/2006)   TG Goal: 150 (12/15/2006)  Prior 10 Yr Risk Heart Disease: Not enough information (06/09/2008)   HDL:58.80 (06/20/2009),  58.90 (04/25/2009)  LDL:DEL (11/19/2007), 92 (21/30/8657)  Chol:258 (06/20/2009), 228 (04/25/2009)  Trig:243.0 (06/20/2009), 236.0 (04/25/2009)  Problem # 4:  FIBROMYALGIA (ICD-729.1)  Her updated medication list for this problem includes:    Mobic 7.5 Mg Tabs (Meloxicam) ..... One by mouth daily  Complete Medication List: 1)  Celexa 20 Mg Tabs (Citalopram hydrobromide) .Marland Kitchen.. 1 once daily 2)  Vitamin D 84696 Unit Caps (Ergocalciferol) .Marland Kitchen.. 1 a week 3)  Valtrex 500 Mg Tabs (Valacyclovir hcl) .Marland Kitchen.. 1 three times a day as needed 4)  Lisinopril-hydrochlorothiazide 20-25 Mg Tabs (Lisinopril-hydrochlorothiazide) .... 1/2 by mouth two times a day 5)  Pantoprazole Sodium 40 Mg Tbec (Pantoprazole sodium) .... One by mouth bid 6)  Krill Oil 1000 Mg Caps (Krill oil) .... Two by mouth two times a day 7)  Fexofenadine-pseudoephedrine 60-120 Mg Xr12h-tab (Fexofenadine-pseudoephedrine) .... One by mouth two times a day prn 8)  Mobic 7.5 Mg Tabs (Meloxicam) .... One by mouth daily 9)  Xyzal 5 Mg Tabs (Levocetirizine dihydrochloride) .Marland Kitchen.. 1 once daily 10)  Trilipix 135 Mg Cpdr (Choline fenofibrate) .... One by mouth twice a week with the livalo 11)  Hyoscyamine Sulfate 0.125 Mg Tabs (Hyoscyamine sulfate) .... One by mouth every 4 hours for bladder spasms 12)  Ferrous Sulfate 325 (65 Fe) Mg Tabs (Ferrous sulfate) .... One by mouth daily  Patient Instructions: 1)  Please schedule a follow-up appointment in 2 months.

## 2010-03-14 NOTE — Assessment & Plan Note (Signed)
Summary: 2 MNTH ROV//SLM pt rsc/njr   Vital Signs:  Patient profile:   70 year old female Height:      67 inches Weight:      174 pounds BMI:     27.35 Temp:     98.2 degrees F oral Pulse rate:   72 / minute Resp:     14 per minute BP sitting:   140 / 80  (left arm)  Vitals Entered By: Willy Eddy, LPN (May 02, 2009 11:24 AM) CC: roa-echo and dopple and labs---pt was told in dec that she was to take vitamin d 50, 000  2 a week and was sent to pharmacy that way, but pt is only taking 1 every weekn, Hypertension Management   CC:  roa-echo and dopple and labs---pt was told in dec that she was to take vitamin d 50, 000  2 a week and was sent to pharmacy that way, but pt is only taking 1 every weekn, and Hypertension Management.  History of Present Illness: follow up of    Hypertension History:      She denies headache, chest pain, palpitations, dyspnea with exertion, orthopnea, PND, peripheral edema, visual symptoms, neurologic problems, syncope, and side effects from treatment.        Positive major cardiovascular risk factors include female age 37 years old or older, hyperlipidemia, and hypertension.  Negative major cardiovascular risk factors include non-tobacco-user status.        Positive history for target organ damage include prior stroke (or TIA).     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) 11)  Osteoarthritis  (ICD-715.90) 12)  Fibromyalgia  (ICD-729.1) 13)  Hypertension   (ICD-401.9)  Current Problems (verified): 1)  Amaurosis Fugax  (ICD-362.34) 2)  Allergy Unspecified Not Elsewhere Classified  (ICD-995.3) 3)  Acute Maxillary Sinusitis  (ICD-461.0) 4)  Allergic Rhinitis Due To Pollen  (ICD-477.0) 5)  Hypoglycemia, Unspecified  (ICD-251.2) 6)  Muscle Cramps  (ICD-729.82) 7)  Gerd, Severe  (ICD-530.81) 8)  Osteopenia  (ICD-733.90) 9)  Hyperlipidemia  (ICD-272.4) 10)  Insufficiency, 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 1 Once Daily 2)  Vitamin D 16109 Unit  Caps (Ergocalciferol) .Marland Kitchen.. 1 Two Times A Week 3)  Valtrex 500 Mg  Tabs (Valacyclovir Hcl) .Marland Kitchen.. 1 Three Times A Day As Needed 4)  Lisinopril-Hydrochlorothiazide 20-25 Mg  Tabs (Lisinopril-Hydrochlorothiazide) .... 1/2 By Mouth Two Times A Day 5)  Pantoprazole Sodium 40 Mg Tbec (Pantoprazole Sodium) .... One By Mouth Bid 6)  Krill Oil 1000 Mg Caps (Krill Oil) .... Two By Mouth Two Times A Day 7)  Fexofenadine-Pseudoephedrine 60-120 Mg Xr12h-Tab (Fexofenadine-Pseudoephedrine) .... One By Mouth Two Times A Day Prn 8)  Mobic 7.5 Mg Tabs (Meloxicam) .... One By Mouth Daily 9)  Xyzal 5 Mg Tabs (Levocetirizine Dihydrochloride) .Marland Kitchen.. 1 Once Daily 10)  Zithromax Z-Pak 250 Mg Tabs (Azithromycin) .... Use  As Directed 11)  Livalo 2 Mg Tabs (Pitavastatin Calcium) .... One By Mouth Daily  Current Medications (verified): 1)  Celexa 20 Mg Tabs (Citalopram Hydrobromide) .Marland Kitchen.. 1 Once Daily 2)  Vitamin D 16109 Unit  Caps (Ergocalciferol) .Marland Kitchen.. 1 A Week 3)  Valtrex 500 Mg  Tabs (Valacyclovir Hcl) .Marland Kitchen.. 1 Three Times A Day As Needed 4)  Lisinopril-Hydrochlorothiazide 20-25 Mg  Tabs (Lisinopril-Hydrochlorothiazide) .... 1/2 By Mouth Two Times A Day 5)  Pantoprazole Sodium 40 Mg Tbec (Pantoprazole Sodium) .... One By Mouth Bid 6)  Krill Oil 1000  Mg Caps (Krill Oil) .... Two By Mouth Two Times A Day 7)  Fexofenadine-Pseudoephedrine 60-120 Mg Xr12h-Tab (Fexofenadine-Pseudoephedrine) .... One By Mouth Two Times A Day Prn 8)  Mobic 7.5 Mg Tabs (Meloxicam) .... One By Mouth Daily 9)  Xyzal 5 Mg Tabs (Levocetirizine Dihydrochloride) .Marland Kitchen.. 1 Once Daily 10)  Livalo 2 Mg Tabs (Pitavastatin Calcium) .... One By Mouth  Twice A Week 11)  Trilipix 135 Mg Cpdr (Choline Fenofibrate) .... One By Mouth Twice A Week With The Livalo  Allergies (verified): 1)  ! * Ivp Dye 2)  ! Penicillin 3)  ! Sulfa  Past History:  Social History: Last updated: 10/15/2006 Married Never Smoked Alcohol use-no Drug use-no Regular exercise-no  Risk Factors: Exercise: no (10/15/2006)  Risk Factors: Smoking Status: never (05/02/2009)  Past medical, surgical, family and social histories (including risk factors) reviewed, and no changes noted (except as noted below).  Past Medical History: Reviewed history from 10/15/2006 and no changes required. Hypertension IBS Fibromyalgia Osteoarthritis steriod induced necrosis of knee Hyperlipidemia Osteopenia  Past Surgical History: Reviewed history from 10/15/2006 and no changes required. 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  Social History: Reviewed history from 10/15/2006 and no changes required. Married Never Smoked Alcohol use-no Drug use-no Regular exercise-no  Review of Systems  The patient denies anorexia, fever, weight loss, weight gain, vision loss, decreased hearing, hoarseness, chest pain, syncope, dyspnea on exertion, peripheral edema, prolonged cough, headaches, hemoptysis, abdominal pain, melena, hematochezia, severe indigestion/heartburn, hematuria, incontinence, genital sores, muscle weakness, suspicious skin lesions, transient blindness, difficulty walking, depression, unusual weight change, abnormal bleeding, enlarged lymph  nodes, angioedema, and breast masses.    Physical Exam  General:  Well-developed,well-nourished,in no acute distress; alert,appropriate and cooperative throughout examination Head:  Normocephalic and atraumatic without obvious abnormalities. No apparent alopecia or balding. Eyes:  no nystagmus Ears:  R ear normal and L ear normal.   Nose:  no external deformity and no nasal discharge.   Mouth:  Oral mucosa and oropharynx without lesions or exudates.  Teeth in good repair. Neck:  No deformities, masses, or tenderness noted. Lungs:  normal respiratory effort and no wheezes.   Heart:  normal rate and regular rhythm.   Abdomen:  Bowel sounds positive,abdomen soft and non-tender without masses, organomegaly or hernias noted. Extremities:  No clubbing, cyanosis, edema, or deformity noted with normal full range of motion of all joints.   Neurologic:  alert & oriented X3, strength normal in all extremities, gait normal, and DTRs symmetrical and normal.     Impression & Recommendations:  Problem # 1:  HYPERLIPIDEMIA (ICD-272.4)  Her updated medication list for this problem includes:    Livalo 2 Mg Tabs (Pitavastatin calcium) ..... One by mouth  twice a week    Trilipix 135 Mg Cpdr (Choline fenofibrate) ..... One by mouth twice a week with the livalo  Labs Reviewed: SGOT: 22 (04/25/2009)  SGPT: 15 (04/25/2009)  Lipid Goals: Chol Goal: 200 (12/15/2006)   HDL Goal: 40 (12/15/2006)   LDL Goal: 100 (12/15/2006)   TG Goal: 150 (12/15/2006)  Prior 10 Yr Risk Heart Disease: Not enough information (06/09/2008)   HDL:58.90 (04/25/2009), 52.50 (11/09/2008)  LDL:DEL (11/19/2007), 92 (16/11/9602)  Chol:228 (04/25/2009), 260 (11/09/2008)  Trig:236.0 (04/25/2009), 295 (11/19/2007)  Problem # 2:  MUSCLE CRAMPS (ICD-729.82) fibromyalgia   Problem # 3:  GERD, SEVERE (ICD-530.81)  Her updated medication list for this problem includes:    Pantoprazole Sodium 40 Mg Tbec (Pantoprazole sodium) ..... One by  mouth bid    Hyoscyamine Sulfate 0.125 Mg Tabs (Hyoscyamine sulfate) ..... One by mouth every 4 hours for bladder spasms  Labs Reviewed: Hgb: 11.2 (04/03/2009)   Hct: 34.5 (04/03/2009)  Problem # 4:  HYPERTENSION (ICD-401.9)  Her updated medication list for this problem includes:    Lisinopril-hydrochlorothiazide 20-25 Mg Tabs (Lisinopril-hydrochlorothiazide) .Marland Kitchen... 1/2 by mouth two times a day  BP today: 140/80 Prior BP: 132/70 (04/03/2009)  Prior 10 Yr Risk Heart Disease: Not enough information (06/09/2008)  Labs Reviewed: K+: 4.1 (11/09/2008) Creat: : 1.3 (11/09/2008)   Chol: 228 (04/25/2009)   HDL: 58.90 (04/25/2009)   LDL: DEL (11/19/2007)   TG: 236.0 (04/25/2009)  Problem # 5:  INTERSTITIAL CYSTITIS (ICD-595.1) lesin sl prn  Complete Medication List: 1)  Celexa 20 Mg Tabs (Citalopram hydrobromide) .Marland Kitchen.. 1 once daily 2)  Vitamin D 54098 Unit Caps (Ergocalciferol) .Marland Kitchen.. 1 a week 3)  Valtrex 500 Mg Tabs (Valacyclovir hcl) .Marland Kitchen.. 1 three times a day as needed 4)  Lisinopril-hydrochlorothiazide 20-25 Mg Tabs (Lisinopril-hydrochlorothiazide) .... 1/2 by mouth two times a day 5)  Pantoprazole Sodium 40 Mg Tbec (Pantoprazole sodium) .... One by mouth bid 6)  Krill Oil 1000 Mg Caps (Krill oil) .... Two by mouth two times a day 7)  Fexofenadine-pseudoephedrine 60-120 Mg Xr12h-tab (Fexofenadine-pseudoephedrine) .... One by mouth two times a day prn 8)  Mobic 7.5 Mg Tabs (Meloxicam) .... One by mouth daily 9)  Xyzal 5 Mg Tabs (Levocetirizine dihydrochloride) .Marland Kitchen.. 1 once daily 10)  Livalo 2 Mg Tabs (Pitavastatin calcium) .... One by mouth  twice a week 11)  Trilipix 135 Mg Cpdr (Choline fenofibrate) .... One by mouth twice a week with the livalo 12)  Hyoscyamine Sulfate 0.125 Mg Tabs (Hyoscyamine sulfate) .... One by mouth every 4 hours for bladder spasms  Hypertension Assessment/Plan:      The patient's hypertensive risk group is category C: Target organ damage and/or diabetes.  Today's  blood pressure is 140/80.  Her blood pressure goal is < 140/90.  Patient Instructions: 1)  Please schedule a follow-up appointment in 2 months. 2)  Hepatic Panel prior to visit, ICD-9:995.20 3)  Lipid Panel prior to visit, ICD-9:272.4

## 2010-03-14 NOTE — Progress Notes (Signed)
Summary: referal for rectal tear  Phone Note Call from Patient   Caller: Patient Call For: [name] MD Reason for Call: Talk to Doctor, Lab or Test Results Complaint: Breathing Problems Summary of Call: was having rectal bleed - saw her husband's dr's PA - was dx with large rectal tear - needs repair. Would like referal from Dr. Ananias Pilgrim who to go to.  Still with bleeding. pt can be reached at [phone] Initial call taken by: Duard Brady LPN,  August 31, 2009 2:01 PM  Follow-up for Phone Call        office visit mad3e with dr Luisa Hart in [place] for 7-26-to arrive at 3:20 for a 3:40 appointment- pt aware and all information given Follow-up by: Willy Eddy, LPN,  August 31, 2009 3:11 PM  New Problems: RECTAL FISSURE (ICD-565.0)   New Problems: RECTAL FISSURE (ICD-565.0)

## 2010-03-14 NOTE — Progress Notes (Signed)
  Phone Note Call from Patient   Summary of Call: Amanda Pham here for lab work and requests z pack be called into for uri with cough she has had for 2 weeks with no improvement -has ov next week- [facility] Initial call taken by: Willy Eddy, LPN,  September 20, 2009 10:32 AM  Follow-up for Phone Call        per dr Lovell Sheehan- may have z pack Follow-up by: Willy Eddy, LPN,  September 20, 2009 12:21 PM    New/Updated Medications: ZITHROMAX Z-PAK 250 MG TABS (AZITHROMYCIN) as directed Prescriptions: ZITHROMAX Z-PAK 250 MG TABS (AZITHROMYCIN) as directed  #6 x 0   Entered by:   Lynann Beaver CMA   Authorized by:   [name] MD   Signed by:   Lynann Beaver CMA on 09/20/2009   Method used:   [phone][facility] (retail)       [address]       [place], [place]  [address]       Ph: [phone]       Fax: [phone]   RxID:   [id number]

## 2010-03-14 NOTE — Progress Notes (Signed)
Summary: lab results  Phone Note Call from Patient Call back at Home Phone [phone]   Caller: Patient Call For: [name] MD Reason for Call: Lab or Test Results Action Taken: Provider Notified Summary of Call: Pt is asking for lab results. Initial call taken by: Lynann Beaver CMA,  November 13, 2009 11:59 AM  Follow-up for Phone Call        left message on machine to return call--per dr Lovell Sheehan she has low iron and she needs to increase her iron to two times a day with food Follow-up by: Willy Eddy, LPN,  November 13, 2009 3:41 PM     Appended Document: lab results PT INFORMED

## 2010-03-14 NOTE — Assessment & Plan Note (Signed)
Summary: 2 month rov/njr---PT [facility] // RS   Vital Signs:  Patient profile:   70 year old female Height:      67 inches Weight:      170 pounds BMI:     26.72 Temp:     98.2 degrees F oral Pulse rate:   72 / minute Resp:     14 per minute BP sitting:   120 / 70  (left arm)  Vitals Entered By: Willy Eddy, LPN (October 30, 2009 1:40 PM)  Nutrition Counseling: Patient's BMI is greater than 25 and therefore counseled on weight management options. CC: roa labs, Lipid Management, Hypertension Management Is Patient Diabetic? No   Primary Care [facility] [place]:  [name] MD  CC:  roa labs, Lipid Management, and Hypertension Management.  History of Present Illness: the pt had a slight reacton to the flu shot at the site of the injection ( from the pharmacy) she has persitant cramps in her legs that she attributes to statin use her fibromyalgia  has falred with a decrease in over al functional status she has noted episodic hypoglycemia blood pressure is controlled reviewed labs and lipid amnagement  Hypertension History:      She denies headache, chest pain, palpitations, dyspnea with exertion, orthopnea, PND, peripheral edema, visual symptoms, neurologic problems, syncope, and side effects from treatment.        Positive major cardiovascular risk factors include female age 78 years old or older, hyperlipidemia, and hypertension.  Negative major cardiovascular risk factors include non-tobacco-user status.        Positive history for target organ damage include prior stroke (or TIA).    Lipid Management History:      Positive NCEP/ATP III risk factors include female age 52 years old or older, hypertension, and prior stroke (or TIA).  Negative NCEP/ATP III risk factors include non-tobacco-user status.     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) 2)  Rectal Fissure  (ICD-565.0) 3)  Gastric Polyp  (ICD-211.1) 4)  Interstitial Cystitis  (ICD-595.1) 5)  Amaurosis Fugax  (ICD-362.34) 6)  Allergy Unspecified Not Elsewhere Classified  (ICD-995.3) 7)  Acute Maxillary Sinusitis  (ICD-461.0) 8)  Allergic Rhinitis Due To Pollen  (ICD-477.0) 9)  Hypoglycemia, Unspecified  (ICD-251.2) 10)  Muscle Cramps  (ICD-729.82) 11)  Gerd, Severe  (ICD-530.81) 12)  Osteopenia  (ICD-733.90) 13)  Hyperlipidemia  (ICD-272.4) 14)  Insufficiency, 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) 15)  Osteoarthritis  (ICD-715.90) 16)  Fibromyalgia  (ICD-729.1) 17)  Hypertension  (ICD-401.9)  Current Problems (verified): 1)  Rectal Fissure  (ICD-565.0) 2)  Gastric Polyp  (ICD-211.1) 3)  Interstitial Cystitis  (ICD-595.1) 4)  Amaurosis Fugax  (ICD-362.34) 5)  Allergy Unspecified Not Elsewhere Classified  (ICD-995.3) 6)  Acute Maxillary Sinusitis  (ICD-461.0) 7)  Allergic Rhinitis Due To Pollen  (ICD-477.0) 8)  Hypoglycemia, Unspecified  (ICD-251.2) 9)  Muscle Cramps  (ICD-729.82) 10)  Gerd, Severe  (ICD-530.81) 11)  Osteopenia  (ICD-733.90) 12)  Hyperlipidemia  (ICD-272.4) 13)  Insufficiency, 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 1 Once Daily 2)  Vitamin D 16109 Unit  Caps (Ergocalciferol) .Marland Kitchen.. 1 A Week 3)  Valtrex 500  Mg  Tabs (Valacyclovir Hcl) .Marland Kitchen.. 1 Three Times A Day As Needed 4)  Lisinopril-Hydrochlorothiazide 20-25 Mg  Tabs (Lisinopril-Hydrochlorothiazide) .... 1/2 By Mouth Two Times A Day 5)  Pantoprazole Sodium 40 Mg Tbec (Pantoprazole Sodium) .... One By Mouth Bid 6)  Krill Oil 1000 Mg Caps (Krill Oil) .... Two By Mouth Two Times A Day 7)  Fexofenadine-Pseudoephedrine 60-120 Mg Xr12h-Tab (Fexofenadine-Pseudoephedrine) .... One By  Mouth Two Times A Day Prn 8)  Mobic 7.5 Mg Tabs (Meloxicam) .... One By Mouth Daily 9)  Xyzal 5 Mg Tabs (Levocetirizine Dihydrochloride) .Marland Kitchen.. 1 Once Daily 10)  Trilipix 135 Mg Cpdr (Choline Fenofibrate) .... One By Mouth Twice A Week With The Livalo 11)  Hyoscyamine Sulfate 0.125 Mg Tabs (Hyoscyamine Sulfate) .... One By Mouth Every 4 Hours For Bladder Spasms 12)  Ferrous Sulfate 325 (65 Fe) Mg Tabs (Ferrous Sulfate) .... One By Mouth Daily 13)  Zithromax Z-Pak 250 Mg Tabs (Azithromycin) .... As Directed  Current Medications (verified): 1)  Celexa 20 Mg Tabs (Citalopram Hydrobromide) .Marland Kitchen.. 1 Once Daily 2)  Vitamin D 04540 Unit  Caps (Ergocalciferol) .Marland Kitchen.. 1 A Week 3)  Valtrex 500 Mg  Tabs (Valacyclovir Hcl) .Marland Kitchen.. 1 Three Times A Day As Needed 4)  Lisinopril-Hydrochlorothiazide 20-25 Mg  Tabs (Lisinopril-Hydrochlorothiazide) .... 1/2 By Mouth Two Times A Day 5)  Pantoprazole Sodium 40 Mg Tbec (Pantoprazole Sodium) .... One By Mouth Bid 6)  Fish Oil 1000 Mg Caps (Omega-3 Fatty Acids) .... Two By Mouth  Two Times A Day ( May Use Flax Seed) 7)  Fexofenadine-Pseudoephedrine 60-120 Mg Xr12h-Tab (Fexofenadine-Pseudoephedrine) .... One By Mouth Two Times A Day Prn 8)  Mobic 7.5 Mg Tabs (Meloxicam) .... One By Mouth Daily 9)  Xyzal 5 Mg Tabs (Levocetirizine Dihydrochloride) .Marland Kitchen.. 1 Once Daily As Needed 10)  Trilipix 135 Mg Cpdr (Choline Fenofibrate) .... One By Mouth Daily 11)  Hyoscyamine Sulfate 0.125 Mg Tabs (Hyoscyamine Sulfate) .... One By Mouth Every 4 Hours For Bladder Spasms 12)  Ferrous Sulfate 325 (65 Fe) Mg Tabs (Ferrous Sulfate) .... One By Mouth Daily  Allergies (verified): 1)  ! * Ivp Dye 2)  ! Penicillin 3)  ! Sulfa 4)  ! * Shell Fish  Past History:  Social History: Last updated: 10/15/2006 Married Never Smoked Alcohol use-no Drug use-no Regular exercise-no  Risk Factors: Exercise: no (10/15/2006)  Risk Factors: Smoking Status: never (10/30/2009)  Past medical, surgical,  family and social histories (including risk factors) reviewed, and no changes noted (except as noted below).  Past Medical History: Reviewed history from 10/15/2006 and no changes required. Hypertension IBS Fibromyalgia Osteoarthritis steriod induced necrosis of knee Hyperlipidemia Osteopenia  Past Surgical History: Reviewed history from 10/15/2006 and no changes required. 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  Social History: Reviewed history from 10/15/2006 and no changes required. Married Never Smoked Alcohol use-no Drug use-no Regular exercise-no  Review of Systems  The patient denies anorexia, fever, weight loss, weight gain, vision loss, decreased hearing, hoarseness, chest pain, syncope, dyspnea on exertion, peripheral edema, prolonged cough, headaches, hemoptysis, abdominal pain, melena, hematochezia, severe indigestion/heartburn, hematuria, incontinence, genital sores, muscle weakness, suspicious skin lesions, transient blindness, difficulty walking, depression, unusual weight change, abnormal bleeding, enlarged lymph nodes, angioedema, and breast masses.    Physical Exam  General:  Well-developed,well-nourished,in no acute distress; alert,appropriate and cooperative throughout examination Head:  Normocephalic and atraumatic without obvious abnormalities. No apparent alopecia or balding. Eyes:  no nystagmus Ears:  R ear  normal and L ear normal.   Nose:  no external deformity and no nasal discharge.   Mouth:  Oral mucosa and oropharynx without lesions or exudates.  Teeth in good repair.   Impression & Recommendations:  Problem # 1:  MUSCLE CRAMPS (ICD-729.82) due to the statins  Problem # 2:  HYPERLIPIDEMIA (ICD-272.4)  Her updated medication list for this problem includes:    Trilipix 135 Mg Cpdr (Choline fenofibrate) ..... One by mouth daily  Labs Reviewed: SGOT: 26 (06/20/2009)   SGPT: 16  (06/20/2009)  Lipid Goals: Chol Goal: 200 (12/15/2006)   HDL Goal: 40 (12/15/2006)   LDL Goal: 100 (12/15/2006)   TG Goal: 150 (12/15/2006)  Prior 10 Yr Risk Heart Disease: Not enough information (06/09/2008)   HDL:49.30 (09/20/2009), 58.80 (06/20/2009)  LDL:DEL (11/19/2007), 92 (40/98/1191)  Chol:226 (09/20/2009), 258 (06/20/2009)  Trig:218.0 (09/20/2009), 243.0 (06/20/2009)  Complete Medication List: 1)  Celexa 20 Mg Tabs (Citalopram hydrobromide) .Marland Kitchen.. 1 once daily 2)  Vitamin D 47829 Unit Caps (Ergocalciferol) .Marland Kitchen.. 1 a week 3)  Valtrex 500 Mg Tabs (Valacyclovir hcl) .Marland Kitchen.. 1 three times a day as needed 4)  Lisinopril-hydrochlorothiazide 20-25 Mg Tabs (Lisinopril-hydrochlorothiazide) .... 1/2 by mouth two times a day 5)  Pantoprazole Sodium 40 Mg Tbec (Pantoprazole sodium) .... One by mouth bid 6)  Fish Oil 1000 Mg Caps (Omega-3 fatty acids) .... Two by mouth  two times a day ( may use flax seed) 7)  Fexofenadine-pseudoephedrine 60-120 Mg Xr12h-tab (Fexofenadine-pseudoephedrine) .... One by mouth two times a day prn 8)  Mobic 7.5 Mg Tabs (Meloxicam) .... One by mouth daily 9)  Xyzal 5 Mg Tabs (Levocetirizine dihydrochloride) .Marland Kitchen.. 1 once daily as needed 10)  Trilipix 135 Mg Cpdr (Choline fenofibrate) .... One by mouth daily 11)  Hyoscyamine Sulfate 0.125 Mg Tabs (Hyoscyamine sulfate) .... One by mouth every 4 hours for bladder spasms 12)  Ferrous Sulfate 325 (65 Fe) Mg Tabs (Ferrous sulfate) .... One by mouth daily  Other Orders: Venipuncture (56213) Specimen Handling (08657) TLB-BMP (Basic Metabolic Panel-BMET) (80048-METABOL) TLB-CBC Platelet - w/Differential (85025-CBCD) TLB-IBC Pnl (Iron/FE;Transferrin) (83550-IBC)  Hypertension Assessment/Plan:      The patient's hypertensive risk group is category C: Target organ damage and/or diabetes.  Today's blood pressure is 120/70.  Her blood pressure goal is < 140/90.  Lipid Assessment/Plan:      Based on NCEP/ATP III, the patient's risk  factor category is "history of coronary disease, peripheral vascular disease, cerebrovascular disease, or aortic aneurysm".  The patient's lipid goals are as follows: Total cholesterol goal is 200; LDL cholesterol goal is 100; HDL cholesterol goal is 40; Triglyceride goal is 150.     Patient Instructions: 1)  Please schedule a follow-up appointment in 3 months. 2)  Hepatic Panel prior to visit, ICD-9:995.20 3)  Lipid Panel prior to visit, ICD-9:272.4 Prescriptions: TRILIPIX 135 MG CPDR (CHOLINE FENOFIBRATE) one by mouth daily  #30 x 11   Entered and Authorized by:   [name] MD   Signed by:   [name] MD on 10/30/2009   Method used:   [facility] to        [facility] [phone]* (retail)       [address]       [place], [place]  [address]       Ph: [phone]       Fax: [phone]   RxID:   [id number]    Immunization History:  Influenza Immunization  History:    Influenza:  historical (10/11/2008)    Influenza:  historical (10/11/2009)

## 2010-03-14 NOTE — Letter (Signed)
Summary: [facility]  [facility]   Imported By: Sherian Rein 07/21/2009 10:23:11  _____________________________________________________________________  External Attachment:    Type:   Image     Comment:   External Document

## 2010-03-14 NOTE — Miscellaneous (Signed)
Summary: Orders Update  Clinical Lists Changes  Orders: Added new Test order of Carotid Duplex (Carotid Duplex) - Signed 

## 2010-03-14 NOTE — Progress Notes (Signed)
  Phone Note Call from Patient   Caller: Patient Call For: [name] MD Summary of Call: saw eye doctor yesterday, he said she has symptoms TIA, needs to be seen sooner- needs bloodwork. Initial call taken by: Raechel Ache, RN,  March 30, 2009 4:25 PM  Follow-up for Phone Call        appt 2/22 @4pm  per Kendal Hymen. Follow-up by: Raechel Ache, RN,  March 30, 2009 4:25 PM

## 2010-03-14 NOTE — Progress Notes (Signed)
Summary: lab results.  Phone Note Call from Patient   Caller: Patient Call For: [name] MD Summary of Call: Pt is calling to ask for lab results and to remind Dr. Lovell Sheehan to fax the labs to her eye MD.  Left his card with Dr. Lovell Sheehan. Initial call taken by: Lynann Beaver CMA,  April 05, 2009 4:35 PM  Follow-up for Phone Call        called again this am Willy Eddy, LPN  April 06, 2009 9:05 AM   Additional Follow-up for Phone Call Additional follow up Details #1::        Pt has normal sed rate so arteritis is less likely CRP was up consistant with inflamation and we will need to look closely at the echo and carotids when they are read any recurrent visual loss would mandate a referra back to Dr love Additional Follow-up by: [name] MD,  April 06, 2009 9:37 AM    Additional Follow-up for Phone Call Additional follow up Details #2::    Pt notified, and labs sent to Dr. Carlynn Purl. Follow-up by: Lynann Beaver CMA,  April 06, 2009 9:53 AM

## 2010-03-21 ENCOUNTER — Other Ambulatory Visit: Payer: Self-pay | Admitting: Internal Medicine

## 2010-03-21 DIAGNOSIS — F329 Major depressive disorder, single episode, unspecified: Secondary | ICD-10-CM

## 2010-03-28 NOTE — Assessment & Plan Note (Signed)
Summary: 3 MTH ROV // RS pt rsc/njr   Vital Signs:  Patient profile:   70 year old female Height:      67 inches Weight:      176 pounds BMI:     27.67 Temp:     98.2 degrees F oral Pulse rate:   72 / minute Resp:     14 per minute BP sitting:   130 / 80  (left arm)  Vitals Entered By: Willy Eddy, LPN (March 07, 2010 10:38 AM) CC: roa- states orth dx her with RA, Hypertension Management Is Patient Diabetic? No   Primary Care Provider:  [name] MD  CC:  roa- states orth dx her with RA and Hypertension Management.  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 she was given 4 injections of cortisone. The pts hand have deformed over the last year with marked ulnar deviaton... He has a diagnosis of fibromyalgia and possible mixed connective tissue? SHe did not repsond to the prednisone and has severe throbbing pain in her hands.  The pt has never taken plaquinil or MTX will screen pt today an consider referral to rhematology   Hypertension History:      She denies headache, chest pain, palpitations, dyspnea with exertion, orthopnea, PND, peripheral edema, visual symptoms, neurologic problems, syncope, and side effects from treatment.        Positive major cardiovascular risk factors include female age 18 years old or older, hyperlipidemia, and hypertension.  Negative major cardiovascular risk factors include non-tobacco-user status.        Positive history for target organ damage include prior stroke (or TIA).    Preventive Screening-Counseling & Management  Alcohol-Tobacco     Smoking Status: never     Tobacco Counseling: not indicated; no tobacco use  Problems Prior to Update: 1)  ? 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) 3)  Rectal Fissure  (ICD-565.0) 4)  Gastric Polyp  (ICD-211.1) 5)  Interstitial Cystitis  (ICD-595.1) 6)  Amaurosis Fugax   (ICD-362.34) 7)  Allergy Unspecified Not Elsewhere Classified  (ICD-995.3) 8)  Acute Maxillary Sinusitis  (ICD-461.0) 9)  Allergic Rhinitis Due To Pollen  (ICD-477.0) 10)  Hypoglycemia, Unspecified  (ICD-251.2) 11)  Muscle Cramps  (ICD-729.82) 12)  Gerd, Severe  (ICD-530.81) 13)  Osteopenia  (ICD-733.90) 14)  Hyperlipidemia  (ICD-272.4) 15)  Insufficiency, 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) 16)  Osteoarthritis  (ICD-715.90) 17)  Fibromyalgia  (ICD-729.1) 18)  Hypertension  (ICD-401.9)  Current Problems (verified): 1)  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) 2)  Rectal Fissure  (ICD-565.0) 3)  Gastric Polyp  (ICD-211.1) 4)  Interstitial Cystitis  (ICD-595.1) 5)  Amaurosis Fugax  (ICD-362.34) 6)  Allergy Unspecified Not Elsewhere Classified  (ICD-995.3) 7)  Acute Maxillary Sinusitis  (ICD-461.0) 8)  Allergic Rhinitis Due To Pollen  (ICD-477.0) 9)  Hypoglycemia, Unspecified  (ICD-251.2) 10)  Muscle Cramps  (ICD-729.82) 11)  Gerd, Severe  (ICD-530.81) 12)  Osteopenia  (ICD-733.90) 13)  Hyperlipidemia  (ICD-272.4) 14)  Insufficiency, 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) 15)  Osteoarthritis  (ICD-715.90) 16)  Fibromyalgia  (ICD-729.1) 17)  Hypertension  (ICD-401.9)  Medications Prior to Update: 1)  Celexa 20 Mg Tabs (Citalopram Hydrobromide) .Marland Kitchen.. 1 Once Daily 2)  Vitamin D 04540 Unit  Caps (Ergocalciferol) .Marland Kitchen.. 1 A Week 3)  Valtrex 500 Mg  Tabs (Valacyclovir Hcl) .Marland Kitchen.. 1 Three Times A Day As Needed 4)  Lisinopril-Hydrochlorothiazide 20-25 Mg  Tabs (Lisinopril-Hydrochlorothiazide) .... 1/2 By Mouth Two Times A Day 5)  Pantoprazole Sodium 40 Mg Tbec (Pantoprazole Sodium) .... One By Mouth Bid 6)  Fish Oil 1000 Mg Caps (Omega-3 Fatty Acids) .... Two By Mouth  Two Times A Day ( May Use Flax Seed) 7)  Fexofenadine-Pseudoephedrine 60-120 Mg Xr12h-Tab (Fexofenadine-Pseudoephedrine) .... One By Mouth Two Times A Day Prn 8)  Mobic 7.5 Mg Tabs (Meloxicam) .... One By Mouth Daily 9)   Xyzal 5 Mg Tabs (Levocetirizine Dihydrochloride) .Marland Kitchen.. 1 Once Daily As Needed 10)  Trilipix 135 Mg Cpdr (Choline Fenofibrate) .... One By Mouth Daily 11)  Hyoscyamine Sulfate 0.125 Mg Tabs (Hyoscyamine Sulfate) .... One By Mouth Every 4 Hours For Bladder Spasms 12)  Ferrous Sulfate 325 (65 Fe) Mg Tabs (Ferrous Sulfate) .... One By Mouth Daily  Current Medications (verified): 1)  Celexa 20 Mg Tabs (Citalopram Hydrobromide) .Marland Kitchen.. 1 Once Daily 2)  Vitamin D 04540 Unit  Caps (Ergocalciferol) .Marland Kitchen.. 1 A Week 3)  Valtrex 500 Mg  Tabs (Valacyclovir Hcl) .Marland Kitchen.. 1 Three Times A Day As Needed 4)  Lisinopril-Hydrochlorothiazide 20-25 Mg  Tabs (Lisinopril-Hydrochlorothiazide) .... 1/2 By Mouth Two Times A Day 5)  Pantoprazole Sodium 40 Mg Tbec (Pantoprazole Sodium) .... One By Mouth Bid 6)  Lovaza 1 Gm Caps (Omega-3-Acid Ethyl Esters) .... Two By Mouth Two Times A Day 7)  Mobic 7.5 Mg Tabs (Meloxicam) .... One By Mouth Daily 8)  Xyzal 5 Mg Tabs (Levocetirizine Dihydrochloride) .Marland Kitchen.. 1 Once Daily As Needed 9)  Hyoscyamine Sulfate 0.125 Mg Tabs (Hyoscyamine Sulfate) .... One By Mouth Every 4 Hours For Bladder Spasms 10)  Plaquenil 200 Mg Tabs (Hydroxychloroquine Sulfate) .... One By Mouth Daily  Allergies (verified): 1)  ! * Ivp Dye 2)  ! Penicillin 3)  ! Sulfa 4)  ! * Shell Fish  Past History:  Social History: Last updated: 10/15/2006 Married Never Smoked Alcohol use-no Drug use-no Regular exercise-no  Risk Factors: Exercise: no (10/15/2006)  Risk Factors: Smoking Status: never (03/07/2010)  Past medical, surgical, family and social histories (including risk factors) reviewed, and no changes noted (except as noted below).  Past Medical History: Reviewed history from 10/15/2006 and no changes required. Hypertension IBS Fibromyalgia Osteoarthritis steriod induced necrosis of knee Hyperlipidemia Osteopenia  Past Surgical History: Reviewed history from 10/15/2006 and no changes  required. 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  Social History: Reviewed history from 10/15/2006 and no changes required. Married Never Smoked Alcohol use-no Drug use-no Regular exercise-no  Review of Systems  The patient denies anorexia, fever, weight loss, weight gain, vision loss, decreased hearing, hoarseness, chest pain, syncope, dyspnea on exertion, peripheral edema, prolonged cough, headaches, hemoptysis, abdominal pain, melena, hematochezia, severe indigestion/heartburn, hematuria, incontinence, genital sores, muscle weakness, suspicious skin lesions, transient blindness, difficulty walking, depression, unusual weight change, abnormal bleeding, enlarged lymph nodes, angioedema, and breast masses.    Physical Exam  General:  Well-developed,well-nourished,in no acute distress; alert,appropriate and cooperative throughout examination Head:  Normocephalic and atraumatic without obvious abnormalities. No apparent alopecia or balding. Eyes:  no nystagmus Nose:  no external deformity and no nasal discharge.   Mouth:  Oral mucosa and oropharynx without lesions or exudates.  Teeth in good repair. Neck:  No deformities, masses, or tenderness noted. Lungs:  normal respiratory effort and no wheezes.   Abdomen:  Bowel sounds positive,abdomen soft and non-tender without masses, organomegaly or hernias noted. Msk:  ulnar deviationdecreased ROM, joint tenderness,  and joint swelling.   Extremities:  No clubbing, cyanosis, edema, or deformity noted with normal full range of motion of all joints.     Impression & Recommendations:  Problem # 1:  ? of RHEUMATOID ARTHRITIS (ICD-714.0) Assessment Deteriorated pt has the stigmata of ra but has had negative testing in the past. ? of sero negative dz.'if tess positive will treat if negative will refer for consideration of sero negative dz Orders: Venipuncture (16109) T- * Misc.  Laboratory test [id number]) Specimen Handling (09811) TLB-CRP-High Sensitivity (C-Reactive Protein) (86140-FCRP)  Problem # 2:  HYPERLIPIDEMIA (ICD-272.4) Assessment: Unchanged  The following medications were removed from the medication list:    Trilipix 135 Mg Cpdr (Choline fenofibrate) ..... One by mouth daily Her updated medication list for this problem includes:    Lovaza 1 Gm Caps (Omega-3-acid ethyl esters) .Marland Kitchen..Marland Kitchen Two by mouth two times a day  Labs Reviewed: SGOT: 26 (01/23/2010)   SGPT: 15 (01/23/2010)  Lipid Goals: Chol Goal: 200 (12/15/2006)   HDL Goal: 40 (12/15/2006)   LDL Goal: 100 (12/15/2006)   TG Goal: 150 (12/15/2006)  Prior 10 Yr Risk Heart Disease: Not enough information (06/09/2008)   HDL:50.90 (01/23/2010), 49.30 (09/20/2009)  LDL:DEL (11/19/2007), 92 (91/47/8295)  Chol:254 (01/23/2010), 226 (09/20/2009)  Trig:183.0 (01/23/2010), 218.0 (09/20/2009)  Problem # 3:  GERD, SEVERE (ICD-530.81)  Her updated medication list for this problem includes:    Pantoprazole Sodium 40 Mg Tbec (Pantoprazole sodium) ..... One by mouth bid    Hyoscyamine Sulfate 0.125 Mg Tabs (Hyoscyamine sulfate) ..... One by mouth every 4 hours for bladder spasms  Labs Reviewed: Hgb: 11.6 (10/30/2009)   Hct: 35.2 (10/30/2009)  Complete Medication List: 1)  Celexa 20 Mg Tabs (Citalopram hydrobromide) .Marland Kitchen.. 1 once daily 2)  Vitamin D 62130 Unit Caps (Ergocalciferol) .Marland Kitchen.. 1 a week 3)  Valtrex 500 Mg Tabs (Valacyclovir hcl) .Marland Kitchen.. 1 three times a day as needed 4)  Lisinopril-hydrochlorothiazide 20-25 Mg Tabs (Lisinopril-hydrochlorothiazide) .... 1/2 by mouth two times a day 5)  Pantoprazole Sodium 40 Mg Tbec (Pantoprazole sodium) .... One by mouth bid 6)  Lovaza 1 Gm Caps (Omega-3-acid ethyl esters) .... Two by mouth two times a day 7)  Mobic 7.5 Mg Tabs (Meloxicam) .... One by mouth daily 8)  Xyzal 5 Mg Tabs (Levocetirizine dihydrochloride) .Marland Kitchen.. 1 once daily as needed 9)  Hyoscyamine Sulfate 0.125 Mg  Tabs (Hyoscyamine sulfate) .... One by mouth every 4 hours for bladder spasms 10)  Plaquenil 200 Mg Tabs (Hydroxychloroquine sulfate) .... One by mouth daily  Hypertension Assessment/Plan:      The patient's hypertensive risk group is category C: Target organ damage and/or diabetes.  Today's blood pressure is 130/80.  Her blood pressure goal is < 140/90.   Patient Instructions: 1)  Please schedule a follow-up appointment in 2 months. 2)  Lipid Panel prior to visit, ICD-9: 272.4 Prescriptions: LOVAZA 1 GM CAPS (OMEGA-3-ACID ETHYL ESTERS) two by mouth two times a day  #120 x 7   Entered and Authorized by:   [name] MD   Signed by:   [name] MD on 03/07/2010   Method used:   Electronically to        [facility] [phone]* (retail)       [address]       [place], [place]  [address]       Ph: [phone]       Fax: [phone]   RxID:  [id number] PLAQUENIL 200 MG TABS (HYDROXYCHLOROQUINE SULFATE) one by mouth daily  #30 x 11   Entered and Authorized by:   [name] MD   Signed by:   [name] MD on 03/07/2010   Method used:   [facility]ronically to        [facility] [phone]* (retail)       [address]       [place], [place]  [address]       Ph: [phone]       Fax: [phone]   RxID:   [id number] MOBIC 7.5 MG TABS (MELOXICAM) one by mouth daily  #30 x 6   Entered by:   Willy Eddy, LPN   Authorized by:   [name] MD   Signed by:   Willy Eddy, LPN on 34/74/2595   Method used:   [facility]ronically to        [facility] [phone]* (retail)       [address]       [place], [place]  [address]       Ph: [phone]       Fax: [phone]   RxID:   [id number] VALTREX 500 MG  TABS (VALACYCLOVIR HCL) 1 three times a day as needed  #30 Tablet x 1   Entered by:   Willy Eddy, LPN   Authorized by:   [name] MD   Signed by:    Willy Eddy, LPN on 62/37/6283   Method used:   Electronically to        [facility] [phone]* (retail)       [address]       [place], [place]  [address]       Ph: [phone]       Fax: [phone]   RxID:   [id number]    Orders Added: 1)  Venipuncture [93810] 2)  T- * Misc. Laboratory test [99999] 3)  Specimen Handling [99000] 4)  TLB-CRP-High Sensitivity (C-Reactive Protein) [86140-FCRP] 5)  Est. Patient Level IV [17510]

## 2010-04-23 ENCOUNTER — Other Ambulatory Visit: Payer: Self-pay | Admitting: Internal Medicine

## 2010-04-25 ENCOUNTER — Other Ambulatory Visit: Payer: Self-pay

## 2010-04-26 ENCOUNTER — Other Ambulatory Visit (INDEPENDENT_AMBULATORY_CARE_PROVIDER_SITE_OTHER): Payer: Medicare Other

## 2010-04-26 DIAGNOSIS — E785 Hyperlipidemia, unspecified: Secondary | ICD-10-CM

## 2010-04-26 LAB — LIPID PANEL: Total CHOL/HDL Ratio: 5

## 2010-04-26 LAB — LDL CHOLESTEROL, DIRECT: Direct LDL: 171.5 mg/dL

## 2010-04-30 ENCOUNTER — Encounter: Payer: Self-pay | Admitting: Internal Medicine

## 2010-05-02 ENCOUNTER — Ambulatory Visit (INDEPENDENT_AMBULATORY_CARE_PROVIDER_SITE_OTHER): Payer: Medicare Other | Admitting: Internal Medicine

## 2010-05-02 ENCOUNTER — Encounter: Payer: Self-pay | Admitting: Internal Medicine

## 2010-05-02 DIAGNOSIS — M129 Arthropathy, unspecified: Secondary | ICD-10-CM

## 2010-05-02 DIAGNOSIS — E785 Hyperlipidemia, unspecified: Secondary | ICD-10-CM

## 2010-05-02 DIAGNOSIS — IMO0001 Reserved for inherently not codable concepts without codable children: Secondary | ICD-10-CM

## 2010-05-02 HISTORY — DX: Arthropathy, unspecified: M12.9

## 2010-05-02 MED ORDER — TAPENTADOL HCL 75 MG PO TABS: 1.0000 | ORAL_TABLET | Freq: Every day | ORAL | Status: DC

## 2010-05-02 MED ORDER — MELOXICAM 7.5 MG PO TABS: 7.5000 mg | ORAL_TABLET | Freq: Every day | ORAL | Status: DC

## 2010-05-02 NOTE — Assessment & Plan Note (Signed)
Patient has polyarticular arthritis we have given her a trial of Plaquenil to see if we could alleviate some of her arthritic pain but she could not tolerate this medication. She has an appointment with a rheumatologist in May at [facility] I think we will defer medication changes until she sees the rheumatologist to make sure that none of the medications we might choose might interfere with his testing. We will allow her some nucenta to take a daily basis for pain control until she is seen

## 2010-05-02 NOTE — Assessment & Plan Note (Signed)
Multi sign point tenderness with interference with functioning Control of pain and mobility are current goals

## 2010-05-02 NOTE — Assessment & Plan Note (Signed)
She was not able to afford the lovaza and is concerned about fish oil and the risks of mercury She has found a brand that seems to be purified but has not started this yet Goal is ldl c of 130

## 2010-05-02 NOTE — Progress Notes (Signed)
  Subjective:    Patient ID: Amanda Pham, female    DOB: 04/25/1940, 70 y.o.   MRN: [id number]  HPI Pt seen for lipid follow up, Hypertension and GERD She admits to not having taken her fish oil and to have been intolerant of multiple statins and fibrionates. She has an appointment with a rhematologist to discuss her poly articular arthritis but no screening tests have been diagnostic.    Review of Systems  Constitutional: Negative for activity change, appetite change and fatigue.  HENT: Negative for ear pain, congestion, neck pain, postnasal drip and sinus pressure.   Eyes: Negative for redness and visual disturbance.  Respiratory: Negative for cough, shortness of breath and wheezing.   Gastrointestinal: Negative for abdominal pain and abdominal distention.  Genitourinary: Negative for dysuria, frequency and menstrual problem.  Musculoskeletal: Positive for myalgias, joint swelling and arthralgias.  Skin: Negative for rash and wound.  Neurological: Negative for dizziness, weakness and headaches.  Hematological: Negative for adenopathy. Does not bruise/bleed easily.  Psychiatric/Behavioral: Negative for sleep disturbance and decreased concentration.   Past Medical History  Diagnosis Date  . Hypertension   . IBS (irritable bowel syndrome)   . Fibromyalgia   . Arthritis   . Knee osteonecrosis   . Hyperlipidemia   . Osteopenia    Past Surgical History  Procedure Date  . Replacement total knee     reports that she has never smoked. She does not have any smokeless tobacco history on file. She reports that she does not drink alcohol or use illicit drugs. family history is not on file. Allergies  Allergen Reactions  . Penicillins   . Sulfonamide Derivatives        Objective:   Physical Exam  Constitutional: She is oriented to person, place, and time. She appears well-developed and well-nourished. No distress.  HENT:  Head: Normocephalic and atraumatic.  Right Ear:  External ear normal.  Left Ear: External ear normal.  Nose: Nose normal.  Mouth/Throat: Oropharynx is clear and moist.  Eyes: Conjunctivae and EOM are normal. Pupils are equal, round, and reactive to light.  Neck: Normal range of motion. Neck supple. No JVD present. No tracheal deviation present. No thyromegaly present.  Cardiovascular: Normal rate, regular rhythm, normal heart sounds and intact distal pulses.   No murmur heard. Pulmonary/Chest: Effort normal and breath sounds normal. She has no wheezes. She exhibits no tenderness.  Abdominal: Soft. Bowel sounds are normal. There is tenderness.  Musculoskeletal: She exhibits tenderness. She exhibits no edema.  Lymphadenopathy:    She has no cervical adenopathy.  Neurological: She is alert and oriented to person, place, and time. She has normal reflexes. No cranial nerve deficit.  Skin: Skin is warm and dry. She is not diaphoretic.  Psychiatric: She has a normal mood and affect. Her behavior is normal.          Assessment & Plan:

## 2010-05-08 ENCOUNTER — Ambulatory Visit: Payer: Self-pay | Admitting: Internal Medicine

## 2010-05-09 ENCOUNTER — Telehealth: Payer: Self-pay | Admitting: *Deleted

## 2010-05-09 NOTE — Telephone Encounter (Signed)
Dr Lovell Sheehan gave her a rx for Nucyntaer #180 pills.  Her insurance will only cover #90 and Dr Lovell Sheehan gave her a voucher for 10 pills free.  She needs 2 separate rx's for p/u

## 2010-05-13 ENCOUNTER — Telehealth: Payer: Self-pay | Admitting: *Deleted

## 2010-05-13 DIAGNOSIS — J329 Chronic sinusitis, unspecified: Secondary | ICD-10-CM

## 2010-05-13 DIAGNOSIS — M129 Arthropathy, unspecified: Secondary | ICD-10-CM

## 2010-05-13 MED ORDER — AZITHROMYCIN 250 MG PO TABS: ORAL_TABLET | ORAL | Status: DC

## 2010-05-13 MED ORDER — TAPENTADOL HCL 75 MG PO TABS: 75.0000 mg | ORAL_TABLET | Freq: Every day | ORAL | Status: DC

## 2010-05-13 MED ORDER — TAPENTADOL HCL 75 MG PO TABS: 1.0000 | ORAL_TABLET | Freq: Every day | ORAL | Status: AC

## 2010-05-13 NOTE — Telephone Encounter (Signed)
Pt needs Zpack for sinus infection, congestion, headache, eyes hurt

## 2010-05-13 NOTE — Telephone Encounter (Signed)
Dr Lovell Sheehan will take care of it

## 2010-05-13 NOTE — Telephone Encounter (Signed)
nucynta scripts are ready for pick up- z pack called in

## 2010-06-17 ENCOUNTER — Encounter: Payer: Self-pay | Admitting: Internal Medicine

## 2010-06-19 ENCOUNTER — Telehealth: Payer: Self-pay | Admitting: *Deleted

## 2010-06-19 NOTE — Telephone Encounter (Signed)
here

## 2010-06-19 NOTE — Telephone Encounter (Signed)
Does pt need stress test or cardiolyte?

## 2010-06-19 NOTE — Telephone Encounter (Signed)
Per Dr. Lovell Sheehan, should be Myoview.  Left message with Terri to change to Myoview.

## 2010-06-19 NOTE — Telephone Encounter (Signed)
Pt is having left sided chest pain with left arm tingling today.  BP 144/72  No SOB, Sweating or Jaw pain.  ? Of exertional pain, thinks it may be a little worse.  Per Dr. Lovell Sheehan, get Stress Test, and go ER if pain becomes different, increased, any SOB or suspicious cardiac symptoms.

## 2010-06-20 ENCOUNTER — Encounter: Payer: Self-pay | Admitting: Internal Medicine

## 2010-06-26 ENCOUNTER — Encounter (HOSPITAL_COMMUNITY): Payer: Medicare Other | Admitting: Radiology

## 2010-06-27 ENCOUNTER — Ambulatory Visit (HOSPITAL_COMMUNITY): Payer: Medicare Other | Attending: Internal Medicine | Admitting: Radiology

## 2010-06-27 VITALS — Ht 65.0 in | Wt 177.0 lb

## 2010-06-27 DIAGNOSIS — R079 Chest pain, unspecified: Secondary | ICD-10-CM | POA: Insufficient documentation

## 2010-06-27 MED ORDER — REGADENOSON 0.4 MG/5ML IV SOLN
0.4000 mg | Freq: Once | INTRAVENOUS | Status: AC
  Administered 2010-06-27: 0.4 mg via INTRAVENOUS

## 2010-06-27 MED ORDER — TECHNETIUM TC 99M TETROFOSMIN IV KIT
33.0000 | PACK | Freq: Once | INTRAVENOUS | Status: AC | PRN
  Administered 2010-06-27: 33 via INTRAVENOUS

## 2010-06-27 MED ORDER — TECHNETIUM TC 99M TETROFOSMIN IV KIT
11.0000 | PACK | Freq: Once | INTRAVENOUS | Status: AC | PRN
  Administered 2010-06-27: 11 via INTRAVENOUS

## 2010-06-27 MED ORDER — AMINOPHYLLINE 25 MG/ML IV SOLN
75.0000 mg | INTRAVENOUS | Status: AC
  Administered 2010-06-27: 75 mg via INTRAVENOUS

## 2010-06-27 NOTE — Progress Notes (Signed)
[facility] SITE 3 NUCLEAR MED [address] [place] [address] [phone]  Cardiology Nuclear Med Study  Amanda Pham is a 70 y.o. female [id number] 1940-12-03   Nuclear Med Background Indication for Stress Test:  Evaluation for Ischemia History: 03/11 Echo: EF 65% mild LVH and 67yrs ago MPS: (HP) Cardiac Risk Factors: Hypertension, Lipids and TIA  Symptoms:  Chest Pain   Nuclear Pre-Procedure Caffeine/Decaff Intake:  None NPO After: 9:00pm   Lungs:  clear IV 0.9% NS with Angio Cath:  22g  IV Site: R Hand  IV Started by:  Stanton Kidney, EMT-P  Chest Size (in):  38  Cup Size: C  Height: 5\' 5"  (1.651 m)  Weight:  177 lb (80.287 kg)  BMI:  Body mass index is 29.45 kg/(m^2). Tech Comments:  NA    Nuclear Med Study 1 or 2 day study: 1 day  Stress Test Type:  Lexiscan  Reading MD: Willa Rough, MD  Order Authorizing Provider:  J.Jenkins  Resting Radionuclide: Technetium 39m Tetrofosmin  Resting Radionuclide Dose: 11.0 mCi   Stress Radionuclide:  Technetium 29m Tetrofosmin  Stress Radionuclide Dose: 33.0 mCi           Stress Protocol Rest HR: 80 Stress HR: 91  Rest BP: 130/76 Stress BP: 138/77  Exercise Time (min): n/a METS: n/a   Predicted Max HR: 151 bpm % Max HR: 60.26 bpm Rate Pressure Product: 29528   Dose of Adenosine (mg):  n/a Dose of Lexiscan: 0.4 mg  Dose of Atropine (mg): n/a Dose of Dobutamine: n/a mcg/kg/min (at max HR)  Stress Test Technologist: Milana Na, EMT-P  Nuclear Technologist:  Domenic Polite, CNMT     Rest Procedure:  Myocardial perfusion imaging was performed at rest 45 minutes following the intravenous administration of Technetium 39m Tetrofosmin. Rest ECG: Sinus Bradycardia with PAT  Stress Procedure:  The patient received IV Lexiscan 0.4 mg over 15-seconds.  Technetium 76m Tetrofosmin injected at 30-seconds.  There were non specific changes and PAT with Lexiscan.  Quantitative spect images were obtained after  a 45 minute delay. Stress ECG: No significant ST segment change suggestive of ischemia.  QPS Raw Data Images:  Patient motion noted; appropriate software correction applied. Stress Images:  Normal homogeneous uptake in all areas of the myocardium. Rest Images:  Normal homogeneous uptake in all areas of the myocardium. Subtraction (SDS):  No evidence of ischemia. Transient Ischemic Dilatation (Normal <1.22):  1.06 Lung/Heart Ratio (Normal <0.45):  0.31  Quantitative Gated Spect Images QGS EDV:  71 ml QGS ESV:  15 ml QGS cine images:  Normal Wall Motion QGS EF: 80%  Impression Exercise Capacity:  Lexiscan with no exercise. BP Response:  Normal blood pressure response. Clinical Symptoms:  chest tightness ECG Impression:  No significant ST segment change suggestive of ischemia. Comparison with Prior Nuclear Study: No images to compare  Overall Impression:  Normal stress nuclear study.   Willa Rough

## 2010-06-28 ENCOUNTER — Telehealth: Payer: Self-pay | Admitting: *Deleted

## 2010-06-28 ENCOUNTER — Encounter: Payer: Self-pay | Admitting: Internal Medicine

## 2010-06-28 ENCOUNTER — Ambulatory Visit (INDEPENDENT_AMBULATORY_CARE_PROVIDER_SITE_OTHER): Payer: Medicare Other | Admitting: Internal Medicine

## 2010-06-28 VITALS — BP 130/80 | HR 76 | Temp 98.2°F | Resp 16 | Ht 67.0 in | Wt 173.0 lb

## 2010-06-28 DIAGNOSIS — I10 Essential (primary) hypertension: Secondary | ICD-10-CM

## 2010-06-28 DIAGNOSIS — K219 Gastro-esophageal reflux disease without esophagitis: Secondary | ICD-10-CM

## 2010-06-28 DIAGNOSIS — IMO0001 Reserved for inherently not codable concepts without codable children: Secondary | ICD-10-CM

## 2010-06-28 MED ORDER — DEXLANSOPRAZOLE 60 MG PO CPDR: 60.0000 mg | DELAYED_RELEASE_CAPSULE | Freq: Every day | ORAL | Status: DC

## 2010-06-28 MED ORDER — DICYCLOMINE HCL 10 MG PO CAPS: 10.0000 mg | ORAL_CAPSULE | Freq: Three times a day (TID) | ORAL | Status: AC

## 2010-06-28 NOTE — Patient Instructions (Signed)
negative PPD Safe for prolia

## 2010-06-28 NOTE — Progress Notes (Signed)
  Subjective:    Patient ID: Amanda Pham, female    DOB: [birth year], 70 y.o.   MRN: [id number]  HPI The pt had a stress test with good results and the chest pain is now felt to be consistant with her GERD Her husband is ill and there is increased stress in family She has fibromyalgia She has a hx of GERD and esophagitis   Review of Systems  Constitutional: Negative for activity change, appetite change and fatigue.  HENT: Negative for ear pain, congestion, neck pain, postnasal drip and sinus pressure.   Eyes: Negative for redness and visual disturbance.  Respiratory: Negative for cough, shortness of breath and wheezing.   Gastrointestinal: Negative for abdominal pain and abdominal distention.  Genitourinary: Negative for dysuria, frequency and menstrual problem.  Musculoskeletal: Negative for myalgias, joint swelling and arthralgias.  Skin: Negative for rash and wound.  Neurological: Negative for dizziness, weakness and headaches.  Hematological: Negative for adenopathy. Does not bruise/bleed easily.  Psychiatric/Behavioral: Negative for sleep disturbance and decreased concentration.   Past Medical History  Diagnosis Date  . Hypertension   . IBS (irritable bowel syndrome)   . Fibromyalgia   . Arthritis   . Knee osteonecrosis   . Hyperlipidemia   . Osteopenia    Past Surgical History  Procedure Date  . Replacement total knee     reports that she has never smoked. She does not have any smokeless tobacco history on file. She reports that she does not drink alcohol or use illicit drugs. family history is not on file. Allergies  Allergen Reactions  . Ivp Dye (Iodinated Diagnostic Agents)   . Penicillins   . Sulfonamide Derivatives        Objective:   Physical Exam  Constitutional: She is oriented to person, place, and time. She appears well-developed and well-nourished. No distress.  HENT:  Head: Normocephalic and atraumatic.  Right Ear: External ear normal.  Left  Ear: External ear normal.  Nose: Nose normal.  Mouth/Throat: Oropharynx is clear and moist.  Eyes: Conjunctivae and EOM are normal. Pupils are equal, round, and reactive to light.  Neck: Normal range of motion. Neck supple. No JVD present. No tracheal deviation present. No thyromegaly present.  Cardiovascular: Normal rate, regular rhythm, normal heart sounds and intact distal pulses.   No murmur heard. Pulmonary/Chest: Effort normal and breath sounds normal. She has no wheezes. She exhibits no tenderness.  Abdominal: Soft. Bowel sounds are normal.  Musculoskeletal: Normal range of motion. She exhibits no edema and no tenderness.  Lymphadenopathy:    She has no cervical adenopathy.  Neurological: She is alert and oriented to person, place, and time. She has normal reflexes. No cranial nerve deficit.  Skin: Skin is warm and dry. She is not diaphoretic.  Psychiatric: She has a normal mood and affect. Her behavior is normal.          Assessment & Plan:  The esophageal spasm are worse with stress and she has been using the levsin QID Will try the bentylTID and AC reviewed the fibromyalgia pain control review bone density

## 2010-06-28 NOTE — Assessment & Plan Note (Signed)
[facility]                                 ON-CALL NOTE   Amanda Pham, Amanda Pham                       MRN:          [id number]  DATE:11/14/2006                            DOB:          Mar 28, 1940    PHONE NUMBER:  [phone].  Patient of Dr. Darryll Capers.   CHIEF COMPLAINT:  Post-nasal drip.   PROGRESS NOTE:  She saw Dr. Lovell Sheehan yesterday for throat complaints and  reflux. He has treated this but she began to have sinus drainage today,  which is new and wanted to know if she can take anything for it. Denies  fever, chills, or other symptoms. I recommended Claritin or Zyrtec over-  the-counter 10 mg daily and to followup if not improving. To call if  worse, sinus pain, or fever or other symptoms.     Marne A. Tower, MD  Electronically Signed    MAT/MedQ  DD: 11/14/2006  DT: 11/14/2006  Job #: [id number]

## 2010-06-28 NOTE — Telephone Encounter (Signed)
Pt request muscle relaxer- per dr Lovell Sheehan -he doesnt want to start her on a muscle relaxer when just starting bentyl.  Couldn't leave voice mail at pt's home--did leave message on machine at pharmacy to tell pt this when she picks up bentyl today

## 2010-06-28 NOTE — Progress Notes (Signed)
ROUTED TO DR.JENKINS.Mirna Mires

## 2010-07-02 ENCOUNTER — Encounter: Payer: Self-pay | Admitting: Internal Medicine

## 2010-08-20 ENCOUNTER — Ambulatory Visit (INDEPENDENT_AMBULATORY_CARE_PROVIDER_SITE_OTHER): Payer: Medicare Other | Admitting: Internal Medicine

## 2010-08-20 ENCOUNTER — Encounter: Payer: Self-pay | Admitting: Internal Medicine

## 2010-08-20 DIAGNOSIS — R42 Dizziness and giddiness: Secondary | ICD-10-CM | POA: Insufficient documentation

## 2010-08-20 HISTORY — DX: Dizziness and giddiness: R42

## 2010-08-20 MED ORDER — ONDANSETRON 8 MG PO TBDP: 8.0000 mg | ORAL_TABLET | Freq: Three times a day (TID) | ORAL | Status: AC | PRN

## 2010-08-20 MED ORDER — MECLIZINE HCL 25 MG PO TABS: 25.0000 mg | ORAL_TABLET | Freq: Three times a day (TID) | ORAL | Status: AC | PRN

## 2010-08-20 NOTE — Assessment & Plan Note (Signed)
Suspect viral labyrinthitis. Declines Valium. Antivert prescription provided. Zofran as needed for nausea.Followup if no improvement or worsening.

## 2010-08-20 NOTE — Progress Notes (Signed)
  Subjective:    Patient ID: Amanda Pham, female    DOB: 01-17-1941, 70 y.o.   MRN: [id number]  HPI Pt presents to clinic for evaluation of vertigo. Notes 2-3 day history of vertigo without presyncope, syncope, fall or neurologic deficits such as focal weakness numbness tingling or difficulty with speech. Symptoms were preceded with prodromal feeling of becoming sick with associated myalgias and left ear fullness. No fever chills or ear drainage. Has associated nausea without emesis. Has been taking meclizine when necessary without adverse effect. Vertigo worsened by lateral head turning. No other alleviating or exacerbating factors. No other complaints  Reviewed past medical history, medications and allergies.  Review of Systems see history of present illness     Objective:   Physical Exam  Nursing note and vitals reviewed. Constitutional: She appears well-developed and well-nourished. No distress.  HENT:  Head: Normocephalic and atraumatic.  Right Ear: External ear normal.  Left Ear: External ear normal.  Nose: Nose normal.  Mouth/Throat: Oropharynx is clear and moist. No oropharyngeal exudate.       Left TM dull without perforation, erythema or other abnormality. Right TM normal  Eyes: Conjunctivae and EOM are normal. Pupils are equal, round, and reactive to light. Right eye exhibits no discharge. Left eye exhibits no discharge. No scleral icterus.  Neck: Neck supple.  Cardiovascular: Normal rate, regular rhythm and normal heart sounds.   Neurological: She is alert.  Skin: Skin is warm and dry. She is not diaphoretic.  Psychiatric: She has a normal mood and affect.          Assessment & Plan:

## 2010-09-30 ENCOUNTER — Ambulatory Visit: Payer: Medicare Other | Admitting: Internal Medicine

## 2010-10-11 ENCOUNTER — Ambulatory Visit (INDEPENDENT_AMBULATORY_CARE_PROVIDER_SITE_OTHER): Payer: Medicare Other | Admitting: Internal Medicine

## 2010-10-11 ENCOUNTER — Encounter: Payer: Self-pay | Admitting: Internal Medicine

## 2010-10-11 VITALS — BP 130/70 | HR 72 | Temp 98.2°F | Resp 16 | Ht 65.0 in | Wt 182.0 lb

## 2010-10-11 DIAGNOSIS — M069 Rheumatoid arthritis, unspecified: Secondary | ICD-10-CM

## 2010-10-11 NOTE — Progress Notes (Signed)
Subjective:    Patient ID: Amanda Pham, female    DOB: Dec 15, 1940, 70 y.o.   MRN: [id number]  HPI patient is a 70 year old white female who has carried the diagnosis of fibromyalgia due to severe joint and muscle pain but has been thought to have an underlying rheumatoid diagnosis.  Although her rheumatoid factor and sedimentation rate have been negative she has developed hand deformation this classically rheumatoid in etiology.  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  She has failed a trial of Plaquenil in the past but he did insist that she try Plaquenil again for insurance purposes since the agents to be chosen it was most probably going to be Enbrel.  We had a significant discussion with the patient about the benefits and side effects of Enbrel the risks and benefits of them but were discussed with the patient and her husband in detail Trial of plaquinil ( we had previously tried this)   Review of Systems  Constitutional: Negative for activity change, appetite change and fatigue.  HENT: Negative for ear pain, congestion, neck pain, postnasal drip and sinus pressure.   Eyes: Negative for redness and visual disturbance.  Respiratory: Negative for cough, shortness of breath and wheezing.   Gastrointestinal: Negative for abdominal pain and abdominal distention.  Genitourinary: Negative for dysuria, frequency and menstrual problem.  Musculoskeletal: Negative for myalgias, joint swelling and arthralgias.  Skin: Negative for rash and wound.  Neurological: Negative for dizziness, weakness and headaches.  Hematological: Negative for adenopathy. Does not bruise/bleed easily.  Psychiatric/Behavioral: Negative for sleep disturbance and decreased concentration.   Past Medical History  Diagnosis Date  . Hypertension   . IBS (irritable bowel syndrome)   . Fibromyalgia   . Arthritis   . Knee osteonecrosis   .  Hyperlipidemia   . Osteopenia    Past Surgical History  Procedure Date  . Replacement total knee     reports that she has never smoked. She does not have any smokeless tobacco history on file. She reports that she does not drink alcohol or use illicit drugs. family history is not on file. Allergies  Allergen Reactions  . Ivp Dye (Iodinated Diagnostic Agents)   . Penicillins   . Sulfonamide Derivatives        Objective:   Physical Exam  Nursing note and vitals reviewed. Constitutional: She is oriented to person, place, and time. She appears well-developed and well-nourished. No distress.  HENT:  Head: Normocephalic and atraumatic.  Right Ear: External ear normal.  Left Ear: External ear normal.  Nose: Nose normal.  Mouth/Throat: Oropharynx is clear and moist.  Eyes: Conjunctivae and EOM are normal. Pupils are equal, round, and reactive to light.  Neck: Normal range of motion. Neck supple. No JVD present. No tracheal deviation present. No thyromegaly present.  Cardiovascular: Normal rate, regular rhythm, normal heart sounds and intact distal pulses.   No murmur heard. Pulmonary/Chest: Effort normal and breath sounds normal. She has no wheezes. She exhibits no tenderness.  Abdominal: Soft. Bowel sounds are normal.  Musculoskeletal: She exhibits no edema and no tenderness.       Ulnar deviation of her hands bilaterally  Lymphadenopathy:    She has no cervical adenopathy.  Neurological: She is alert and oriented to person, place, and time. She has normal reflexes. No cranial nerve deficit.  Skin: Skin is warm and dry. She is not diaphoretic.  Psychiatric: She has a normal mood and  affect. Her behavior is normal.          Assessment & Plan:  Spent greater than 30 minutes face-to-face counseling the patient and her husband about the diagnosis of rheumatoid arthritis and use of Enbrel.  Benefits and side effects of them were discussed in detail recommendation was that she do  a trial of an umbrella because of the severity of her joint deformation

## 2010-10-13 ENCOUNTER — Encounter: Payer: Self-pay | Admitting: Internal Medicine

## 2010-12-13 ENCOUNTER — Ambulatory Visit: Payer: Medicare Other | Admitting: Internal Medicine

## 2010-12-31 ENCOUNTER — Other Ambulatory Visit: Payer: Self-pay | Admitting: Internal Medicine

## 2011-02-17 ENCOUNTER — Encounter: Payer: Self-pay | Admitting: Internal Medicine

## 2011-02-17 ENCOUNTER — Ambulatory Visit (INDEPENDENT_AMBULATORY_CARE_PROVIDER_SITE_OTHER): Payer: Medicare Other | Admitting: Internal Medicine

## 2011-02-17 VITALS — BP 130/78 | HR 72 | Temp 98.2°F | Resp 16 | Ht 66.0 in | Wt 176.0 lb

## 2011-02-17 DIAGNOSIS — K219 Gastro-esophageal reflux disease without esophagitis: Secondary | ICD-10-CM

## 2011-02-17 DIAGNOSIS — M069 Rheumatoid arthritis, unspecified: Secondary | ICD-10-CM

## 2011-02-17 DIAGNOSIS — M62838 Other muscle spasm: Secondary | ICD-10-CM

## 2011-02-17 MED ORDER — DEXLANSOPRAZOLE 60 MG PO CPDR: 60.0000 mg | DELAYED_RELEASE_CAPSULE | Freq: Every day | ORAL | Status: AC

## 2011-02-17 MED ORDER — TIZANIDINE HCL 2 MG PO CAPS: 2.0000 mg | ORAL_CAPSULE | Freq: Three times a day (TID) | ORAL | Status: AC

## 2011-02-17 NOTE — Patient Instructions (Addendum)
The patient is instructed to continue all medications as prescribed. Schedule followup with check out clerk upon leaving the clinic Consider Humera since you are having site reactions

## 2011-02-17 NOTE — Progress Notes (Signed)
Subjective:    Patient ID: Amanda Pham, female    DOB: 1940-03-11, 71 y.o.   MRN: [id number]  HPI Presents for follow up She has been on Enbrel for RA She has significant decrease in pain on the medication but has developed a rash at the site of injection that has increased in size with each subsequent injection Last injection was two weeks ago The pain and inflamation has progressively increased Has been tested for TB and had a shingles vaccine Increased neck spasms     Review of Systems  Constitutional: Negative for activity change, appetite change and fatigue.  HENT: Negative for ear pain, congestion, neck pain, postnasal drip and sinus pressure.   Eyes: Negative for redness and visual disturbance.  Respiratory: Negative for cough, shortness of breath and wheezing.   Gastrointestinal: Negative for abdominal pain and abdominal distention.  Genitourinary: Negative for dysuria, frequency and menstrual problem.  Musculoskeletal: Negative for myalgias, joint swelling and arthralgias.  Skin: Negative for rash and wound.  Neurological: Negative for dizziness, weakness and headaches.  Hematological: Negative for adenopathy. Does not bruise/bleed easily.  Psychiatric/Behavioral: Negative for sleep disturbance and decreased concentration.   Past Medical History  Diagnosis Date  . Hypertension   . IBS (irritable bowel syndrome)   . Fibromyalgia   . Arthritis   . Knee osteonecrosis   . Hyperlipidemia   . Osteopenia     History   Social History  . Marital Status: Married    Spouse Name: N/A    Number of Children: N/A  . Years of Education: N/A   Occupational History  . Not on file.   Social History Main Topics  . Smoking status: Never Smoker   . Smokeless tobacco: Not on file  . Alcohol Use: No  . Drug Use: No  . Sexually Active: Not on file   Other Topics Concern  . Not on file   Social History Narrative  . No narrative on file    Past Surgical History    Procedure Date  . Replacement total knee     No family history on file.  Allergies  Allergen Reactions  . Ivp Dye (Iodinated Diagnostic Agents)   . Penicillins   . Sulfonamide Derivatives     Current Outpatient Prescriptions on File Prior to Visit  Medication Sig Dispense Refill  . citalopram (CELEXA) 20 MG tablet TAKE 1 TABLET ONCE DAILY  90 tablet  3  . hyoscyamine (ANASPAZ) 0.125 MG TBDP Place 0.125 mg under the tongue every 4 (four) hours as needed.       Marland Kitchen levocetirizine (XYZAL) 5 MG tablet Take 5 mg by mouth daily as needed.        Marland Kitchen lisinopril-hydrochlorothiazide (PRINZIDE,ZESTORETIC) 20-25 MG per tablet TAKE 1/2 TABLET TWICE A DAY  90 tablet  1  . meclizine (ANTIVERT) 25 MG tablet Take 1 tablet (25 mg total) by mouth 3 (three) times daily as needed for dizziness or nausea.  30 tablet  1  . omega-3 acid ethyl esters (LOVAZA) 1 G capsule Take 2 g by mouth 2 (two) times daily.        . valACYclovir (VALTREX) 500 MG tablet Take 500 mg by mouth 3 (three) times daily as needed.        . Vitamin D, Ergocalciferol, (DRISDOL) 50000 UNITS CAPS TAKE 1 CAPSULE TWO TIMES A WEEK  10 capsule  1    BP 130/78  Pulse 72  Temp 98.2 F (36.8 C)  Resp 16  Ht 5\' 6"  (1.676 m)  Wt 176 lb (79.833 kg)  BMI 28.41 kg/m2       Objective:   Physical Exam  Nursing note and vitals reviewed. Constitutional: She is oriented to person, place, and time. She appears well-developed and well-nourished. No distress.  HENT:  Head: Normocephalic and atraumatic.  Right Ear: External ear normal.  Left Ear: External ear normal.  Nose: Nose normal.  Mouth/Throat: Oropharynx is clear and moist.  Eyes: Conjunctivae and EOM are normal. Pupils are equal, round, and reactive to light.  Neck: Normal range of motion. Neck supple. No JVD present. No tracheal deviation present. No thyromegaly present.  Cardiovascular: Normal rate, regular rhythm, normal heart sounds and intact distal pulses.   No murmur  heard. Pulmonary/Chest: Effort normal and breath sounds normal. She has no wheezes. She exhibits no tenderness.  Abdominal: Soft. Bowel sounds are normal.  Musculoskeletal: Normal range of motion. She exhibits no edema and no tenderness.  Lymphadenopathy:    She has no cervical adenopathy.  Neurological: She is alert and oriented to person, place, and time. She has normal reflexes. No cranial nerve deficit.  Skin: Skin is warm and dry. She is not diaphoretic.  Psychiatric: She has a normal mood and affect. Her behavior is normal.          Assessment & Plan:  Has RA and had a very good response to the embrel but had a local skin reaction Call the rheumatologist for instructions as to whether to use of change to humera Brief course of muscle relaxants for back pain.

## 2011-04-01 ENCOUNTER — Other Ambulatory Visit: Payer: Self-pay | Admitting: Internal Medicine

## 2011-04-14 ENCOUNTER — Other Ambulatory Visit: Payer: Self-pay | Admitting: Internal Medicine

## 2011-04-21 ENCOUNTER — Telehealth: Payer: Self-pay | Admitting: *Deleted

## 2011-04-21 MED ORDER — DOXYCYCLINE HYCLATE 100 MG PO TABS: 100.0000 mg | ORAL_TABLET | Freq: Two times a day (BID) | ORAL | Status: AC

## 2011-04-21 NOTE — Telephone Encounter (Signed)
Pt of Dr. Lovell Sheehan; with RA and takes Embriel.  Has symptoms of sinus infection, and was told she needed to always start an antibiotic as soon as she felt any infectious symptoms.  Congestion, cough, headache. No fever right now.

## 2011-04-21 NOTE — Telephone Encounter (Signed)
Doxycycline 100  #20  One BID  

## 2011-04-21 NOTE — Telephone Encounter (Signed)
Notified pt. 

## 2011-05-12 ENCOUNTER — Other Ambulatory Visit: Payer: Self-pay | Admitting: Internal Medicine

## 2011-05-19 ENCOUNTER — Encounter: Payer: Self-pay | Admitting: Internal Medicine

## 2011-05-19 ENCOUNTER — Ambulatory Visit (INDEPENDENT_AMBULATORY_CARE_PROVIDER_SITE_OTHER): Payer: Medicare Other | Admitting: Internal Medicine

## 2011-05-19 ENCOUNTER — Ambulatory Visit: Payer: Medicare Other | Admitting: Internal Medicine

## 2011-05-19 VITALS — BP 130/80 | HR 72 | Temp 98.6°F | Resp 16 | Ht 65.0 in | Wt 172.0 lb

## 2011-05-19 DIAGNOSIS — J309 Allergic rhinitis, unspecified: Secondary | ICD-10-CM

## 2011-05-19 DIAGNOSIS — M358 Other specified systemic involvement of connective tissue: Secondary | ICD-10-CM

## 2011-05-19 DIAGNOSIS — M351 Other overlap syndromes: Secondary | ICD-10-CM | POA: Insufficient documentation

## 2011-05-19 DIAGNOSIS — K219 Gastro-esophageal reflux disease without esophagitis: Secondary | ICD-10-CM

## 2011-05-19 HISTORY — DX: Other overlap syndromes: M35.1

## 2011-05-19 MED ORDER — LEVOCETIRIZINE DIHYDROCHLORIDE 5 MG PO TABS: 5.0000 mg | ORAL_TABLET | Freq: Every day | ORAL | Status: DC | PRN

## 2011-05-19 MED ORDER — OMEPRAZOLE 40 MG PO CPDR: 40.0000 mg | DELAYED_RELEASE_CAPSULE | Freq: Every day | ORAL | Status: DC

## 2011-05-19 MED ORDER — ESCITALOPRAM OXALATE 10 MG PO TABS
10.0000 mg | ORAL_TABLET | Freq: Every day | ORAL | Status: DC
Start: 2011-05-19 — End: 2011-11-18

## 2011-05-19 NOTE — Patient Instructions (Signed)
The patient is instructed to continue all medications as prescribed. Schedule followup with check out clerk upon leaving the clinic  

## 2011-05-19 NOTE — Progress Notes (Signed)
  Subjective:    Patient ID: Amanda Pham, female    DOB: 1940-12-13, 71 y.o.   MRN: [id number]  HPI Patient is a 71 year old who presents for followup of hypertension irritable bowel syndrome history of fibromyalgia-type disease and most recently diagnosed with labyrinthitis.  She saw an ear nose and throat physician who diagnosed her with chronic distal labyrinthitis and she was sent for physical therapy to aid in control of the symptoms.  She underwent physical therapy and the symptoms improved during physical therapy but however in the intervals between physical therapy the symptoms persist the initial presentation of the symptoms was a 10 over 10 she is today at a 5-6/10  She was given meclizine to control some of the symptomatology   Review of Systems  Constitutional: Negative for activity change, appetite change and fatigue.  HENT: Negative for ear pain, congestion, neck pain, postnasal drip and sinus pressure.   Eyes: Negative for redness and visual disturbance.  Respiratory: Negative for cough, shortness of breath and wheezing.   Gastrointestinal: Negative for abdominal pain and abdominal distention.  Genitourinary: Negative for dysuria, frequency and menstrual problem.  Musculoskeletal: Negative for myalgias, joint swelling and arthralgias.  Skin: Negative for rash and wound.  Neurological: Negative for dizziness, weakness and headaches.  Hematological: Negative for adenopathy. Does not bruise/bleed easily.  Psychiatric/Behavioral: Negative for sleep disturbance and decreased concentration.    The patient is instructed to continue all medications as prescribed. Schedule followup with check out clerk upon leaving the clinic     Objective:   Physical Exam  Nursing note and vitals reviewed. Constitutional: She is oriented to person, place, and time. She appears well-developed and well-nourished. No distress.  HENT:  Head: Normocephalic and atraumatic.  Right Ear: External  ear normal.  Left Ear: External ear normal.  Nose: Nose normal.  Mouth/Throat: Oropharynx is clear and moist.  Eyes: Conjunctivae and EOM are normal. Pupils are equal, round, and reactive to light.  Neck: Normal range of motion. Neck supple. No JVD present. No tracheal deviation present. No thyromegaly present.  Cardiovascular: Normal rate, regular rhythm, normal heart sounds and intact distal pulses.   No murmur heard. Pulmonary/Chest: Effort normal and breath sounds normal. She has no wheezes. She exhibits no tenderness.  Abdominal: Soft. Bowel sounds are normal.  Musculoskeletal: Normal range of motion. She exhibits no edema and no tenderness.  Lymphadenopathy:    She has no cervical adenopathy.  Neurological: She is alert and oriented to person, place, and time. She has normal reflexes. No cranial nerve deficit.  Skin: Skin is warm and dry. She is not diaphoretic.  Psychiatric: She has a normal mood and affect. Her behavior is normal.          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

## 2011-07-25 ENCOUNTER — Telehealth: Payer: Self-pay | Admitting: Internal Medicine

## 2011-07-25 NOTE — Telephone Encounter (Signed)
Per dr Lovell Sheehan- after reviewing last bone density he agrees with reclast - pt informed

## 2011-07-25 NOTE — Telephone Encounter (Signed)
Caller: Ramla/Patient is calling with a question about Reclast IV. States was recommended by office of Dr Francee Gentile [facility] [place] who wants her to take this in IV form 07/28/11, is this something the patient should have?  Please call pt.

## 2011-08-18 ENCOUNTER — Ambulatory Visit: Payer: Medicare Other | Admitting: Internal Medicine

## 2011-09-01 ENCOUNTER — Telehealth: Payer: Self-pay | Admitting: Family Medicine

## 2011-09-01 MED ORDER — LISINOPRIL-HYDROCHLOROTHIAZIDE 20-25 MG PO TABS: ORAL_TABLET | ORAL | Status: DC

## 2011-09-01 NOTE — Telephone Encounter (Signed)
Pt is at [facility]. She forgot her lisinopril-hydrochlorothiazide (PRINZIDE,ZESTORETIC) 20-25 MG per tablet The [facility] states they've already called, but were told they didn't know if it would be done today. Husband is now calling [facility] the [facility], and is asking if she can get 5 pills. Please call him on his cell, or [phone].

## 2011-09-01 NOTE — Telephone Encounter (Signed)
It was done 30 minutes ago through e scribe

## 2011-11-04 ENCOUNTER — Ambulatory Visit: Payer: Medicare Other | Admitting: Internal Medicine

## 2011-11-07 ENCOUNTER — Other Ambulatory Visit: Payer: Self-pay | Admitting: Internal Medicine

## 2011-11-18 ENCOUNTER — Ambulatory Visit (INDEPENDENT_AMBULATORY_CARE_PROVIDER_SITE_OTHER): Payer: Medicare Other | Admitting: Internal Medicine

## 2011-11-18 ENCOUNTER — Encounter: Payer: Self-pay | Admitting: Internal Medicine

## 2011-11-18 VITALS — BP 136/76 | HR 72 | Temp 98.2°F | Resp 16 | Ht 65.0 in | Wt 176.0 lb

## 2011-11-18 DIAGNOSIS — Z23 Encounter for immunization: Secondary | ICD-10-CM

## 2011-11-18 DIAGNOSIS — K589 Irritable bowel syndrome without diarrhea: Secondary | ICD-10-CM

## 2011-11-18 DIAGNOSIS — M069 Rheumatoid arthritis, unspecified: Secondary | ICD-10-CM

## 2011-11-18 NOTE — Progress Notes (Signed)
  Subjective:    Patient ID: Amanda Pham, female    DOB: 03/16/1940, 71 y.o.   MRN: [id number]  HPI On new medications for RA with arm and breast tenderness Increased IBS symptoms   Review of Systems  Eyes: Negative.   Respiratory: Negative.   Cardiovascular: Positive for palpitations and leg swelling.  Genitourinary: Positive for decreased urine volume.       Objective:   Physical Exam  Nursing note and vitals reviewed. Constitutional: She is oriented to person, place, and time. She appears well-developed and well-nourished. No distress.  HENT:  Head: Normocephalic and atraumatic.  Right Ear: External ear normal.  Left Ear: External ear normal.  Nose: Nose normal.  Mouth/Throat: Oropharynx is clear and moist.  Eyes: Conjunctivae normal and EOM are normal. Pupils are equal, round, and reactive to light.  Neck: Normal range of motion. Neck supple. No JVD present. No tracheal deviation present. No thyromegaly present.  Cardiovascular: Normal rate, regular rhythm and intact distal pulses.   Murmur heard. Pulmonary/Chest: She is in respiratory distress. She has wheezes. She exhibits no tenderness.  Abdominal: Soft. Bowel sounds are normal. She exhibits distension. There is tenderness.  Musculoskeletal: Normal range of motion. She exhibits edema and tenderness.  Lymphadenopathy:    She has no cervical adenopathy.  Neurological: She is alert and oriented to person, place, and time. She has normal reflexes. No cranial nerve deficit.  Skin: Skin is warm and dry. She is not diaphoretic.  Psychiatric: She has a normal mood and affect. Her behavior is normal.          Assessment & Plan:  discussion of gluten free diet with inflammation and will attempt a trial  I have spent more than 30 minutes examining this patient face-to-face of which over half was spent in counseling...went over gluten free principles

## 2011-11-24 ENCOUNTER — Other Ambulatory Visit: Payer: Self-pay | Admitting: Internal Medicine

## 2011-12-29 ENCOUNTER — Telehealth: Payer: Self-pay | Admitting: Family Medicine

## 2011-12-29 ENCOUNTER — Telehealth: Payer: Self-pay | Admitting: Internal Medicine

## 2011-12-29 MED ORDER — AZITHROMYCIN 250 MG PO TABS: ORAL_TABLET | ORAL | Status: DC

## 2011-12-29 NOTE — Telephone Encounter (Signed)
Please let pt k now it has been called in to pharmacy

## 2011-12-29 NOTE — Telephone Encounter (Signed)
Patient Information:  Caller Name: Lorra  Phone: [phone]  Patient: Amanda Pham, Amanda Pham  Gender: Female  DOB: 02-13-1940  Age: 71 Years  PCP: Darryll Capers (Adults only)   Symptoms  Reason For Call & Symptoms: sinus sxs  Reviewed Health History In EMR: Yes  Reviewed Medications In EMR: Yes  Reviewed Allergies In EMR: Yes  Date of Onset of Symptoms: 12/26/2011  Treatments Tried: Xyzol, humidified air; Tylenol - helped w/ h/a  Treatments Tried Worked: Yes  Guideline(s) Used:  Sinus Pain and Congestion  Disposition Per Guideline:   See Today in Office  Reason For Disposition Reached:   Earache  Advice Given:  Hydration:  Drink plenty of liquids (6-8 glasses of water daily). If the air in your home is dry, use a cool mist humidifier  Office Follow Up:  Does the office need to follow up with this patient?: Yes  Instructions For The Office: Please ask Dr. Lovell Sheehan if he will authorize a Zpac for pt's sinus sxs. per pt request.  Patient Refused Recommendation:  Patient Requests Prescription  Pt. relays that she can never see her doctor when she is sick and with her extensive health history only wants him and no appts are available.  She requests that [facility] [place] [phone].  RN Note:  Feels sxs are now going into chest. Blowing large amount of green drainage from nose. Hx of chronic sinus infections.

## 2011-12-29 NOTE — Telephone Encounter (Signed)
Pt called re: Triage call. Pt says sinus inf has worsened. Green sputum from sinus. Some blood in sputum. Pt is req to get a zpak. Pt refused to see another dr. Martha Clan call in to [facility] [phone]. Pt req a call back from [place].

## 2011-12-29 NOTE — Telephone Encounter (Signed)
May have z pack per dr jenkins 

## 2011-12-29 NOTE — Telephone Encounter (Signed)
[facility] Triage Call Report Triage Record Num: [id number] Operator: Laren Boom Patient Name: Amanda Pham Call Date & Time: 12/28/2011 2:00:27PM Patient Phone: [phone] PCP: Darryll Capers Patient Gender: Female PCP Fax : [phone] Patient DOB: [birth year] [facility] Name: [facility] Reason for Call: Caller: [name]; PCP: Darryll Capers (Adults only); CB#: [phone]. 12/28/11 - Patient has had some sinus problems since Friday 12/26/11. Lots of Congestion. Nasal Congestion - yellow, blood tinged. Sore Throat. Temp - 98.2*. All Emergent Signs/Symptoms of Upper Respiratory Infection Protocol ruled out except "productive cough with colored sputum" (disposition-see in 24 hours). Home Care Advice Given. Advised to be seen and evaluated. Unable to find appointment with patients PCP - offered appointment with another provider, but patient refused. Stated she would go to [facility] on Monday. Protocol(s) Used: Upper Respiratory Infection (URI) Recommended Outcome per Protocol: See Provider within 24 hours Reason for Outcome: Productive cough with colored sputum (other than clear or white sputum) Care Advice: ~ Use a cool mist humidifier to moisten air. Be sure to clean according to manufacturer's instructions. ~ May inhale steam from hot shower or heated water. Be careful to avoid burns. Increase fluids to 8-12 eight oz (1.6 to 2.4 liters) glasses per day, half of them to be water. Soups, popsicles, fruit juices, non-caffeinated sodas (unless restricting sodium intake), jello, broths, decaf teas, etc. are all okay. Warm fluids can be soothing. ~ Analgesic/Antipyretic Advice - Acetaminophen: Consider acetaminophen as directed on label or by pharmacist/provider for pain or fever PRECAUTIONS: - Use if there is no history of liver disease, alcoholism, or intake of three or more alcohol drinks per day - Only if approved by provider during pregnancy or  when breastfeeding - During pregnancy, acetaminophen should not be taken more than 3 consecutive days without telling provider - Do not exceed recommended dose or frequency ~ ~ Go to the ED if having chest pain with breathing or breathing is becoming more difficult. Call provider if has a fever over 101.5 F (38.6 C) that has not responded to home care measures, having shaking chills or any fever in someone immunocompromised/frail elderly. ~ 12/28/2011 2:21:26PM Page 1 of 1 [id number]

## 2011-12-29 NOTE — Telephone Encounter (Signed)
Per dr jenkins-may have z pack 

## 2012-02-16 ENCOUNTER — Encounter: Payer: Self-pay | Admitting: Internal Medicine

## 2012-02-16 ENCOUNTER — Ambulatory Visit (INDEPENDENT_AMBULATORY_CARE_PROVIDER_SITE_OTHER): Payer: Medicare Other | Admitting: Internal Medicine

## 2012-02-16 VITALS — BP 130/82 | HR 72 | Temp 98.1°F | Resp 16 | Ht 65.0 in | Wt 172.0 lb

## 2012-02-16 DIAGNOSIS — M129 Arthropathy, unspecified: Secondary | ICD-10-CM

## 2012-02-16 DIAGNOSIS — T7840XA Allergy, unspecified, initial encounter: Secondary | ICD-10-CM

## 2012-02-16 NOTE — Progress Notes (Signed)
Subjective:    Patient ID: Amanda Pham, female    DOB: 11-24-1940, 72 y.o.   MRN: [id number]  HPI Follow up for Rhematology  ( allergic reaction to plaqunil) also could not tolerate Has started Ariva 10 Mg for RA Had localized reaction to the Embrel Had rash to to the Plaqunel    Review of Systems  Constitutional: Negative for activity change, appetite change and fatigue.  HENT: Negative for ear pain, congestion, neck pain, postnasal drip and sinus pressure.   Eyes: Negative for redness and visual disturbance.  Respiratory: Negative for cough, shortness of breath and wheezing.   Gastrointestinal: Negative for abdominal pain and abdominal distention.  Genitourinary: Negative for dysuria, frequency and menstrual problem.  Musculoskeletal: Negative for myalgias, joint swelling and arthralgias.  Skin: Negative for rash and wound.  Neurological: Negative for dizziness, weakness and headaches.  Hematological: Negative for adenopathy. Does not bruise/bleed easily.  Psychiatric/Behavioral: Negative for sleep disturbance and decreased concentration.   Past Medical History  Diagnosis Date  . Hypertension   . IBS (irritable bowel syndrome)   . Fibromyalgia   . Arthritis   . Knee osteonecrosis   . Hyperlipidemia   . Osteopenia     History   Social History  . Marital Status: Married    Spouse Name: N/A    Number of Children: N/A  . Years of Education: N/A   Occupational History  . Not on file.   Social History Main Topics  . Smoking status: Never Smoker   . Smokeless tobacco: Not on file  . Alcohol Use: No  . Drug Use: No  . Sexually Active: Not on file   Other Topics Concern  . Not on file   Social History Narrative  . No narrative on file    Past Surgical History  Procedure Date  . Replacement total knee     No family history on file.  Allergies  Allergen Reactions  . Ivp Dye (Iodinated Diagnostic Agents)   . Penicillins   . Plaquenil  (Hydroxychloroquine Sulfate)   . Sulfonamide Derivatives     Current Outpatient Prescriptions on File Prior to Visit  Medication Sig Dispense Refill  . citalopram (CELEXA) 20 MG tablet TAKE 1 TABLET ONCE DAILY  90 tablet  3  . escitalopram (LEXAPRO) 10 MG tablet Take 10 mg by mouth daily.      . hyoscyamine (ANASPAZ) 0.125 MG TBDP Place 0.125 mg under the tongue every 4 (four) hours as needed.       . leflunomide (ARAVA) 10 MG tablet Take 10 mg by mouth daily.      Marland Kitchen levocetirizine (XYZAL) 5 MG tablet Take 1 tablet (5 mg total) by mouth daily as needed.  30 tablet  11  . lisinopril-hydrochlorothiazide (PRINZIDE,ZESTORETIC) 20-25 MG per tablet Take 1/2 tab twice a day  10 tablet  1  . lisinopril-hydrochlorothiazide (PRINZIDE,ZESTORETIC) 20-25 MG per tablet TAKE 1/2 TABLET TWICE A DAY  90 tablet  1  . omega-3 acid ethyl esters (LOVAZA) 1 G capsule Take 2 g by mouth 2 (two) times daily.        Marland Kitchen omeprazole (PRILOSEC) 40 MG capsule TAKE 1 CAPSULE (40 MG TOTAL) BY MOUTH DAILY.  30 capsule  5  . tizanidine (ZANAFLEX) 2 MG capsule Take 1 capsule (2 mg total) by mouth 3 (three) times daily.  60 capsule  1  . valACYclovir (VALTREX) 500 MG tablet TAKE 1 TABLET THREE TIMES A DAY AS NEEDED  30 tablet  0  .  Vitamin D, Ergocalciferol, (DRISDOL) 50000 UNITS CAPS TAKE 1 CAPSULE TWO TIMES A WEEK  10 capsule  1    BP 130/82  Pulse 72  Temp 98.1 F (36.7 C)  Resp 16  Ht 5\' 5"  (1.651 m)  Wt 172 lb (78.019 kg)  BMI 28.62 kg/m2       Objective:   Physical Exam  Nursing note and vitals reviewed. Constitutional: She is oriented to person, place, and time. She appears well-developed and well-nourished. No distress.  HENT:  Head: Normocephalic and atraumatic.  Right Ear: External ear normal.  Left Ear: External ear normal.  Nose: Nose normal.  Mouth/Throat: Oropharynx is clear and moist.  Eyes: Conjunctivae normal and EOM are normal. Pupils are equal, round, and reactive to light.  Neck: Normal  range of motion. Neck supple. No JVD present. No tracheal deviation present. No thyromegaly present.  Cardiovascular: Normal rate, regular rhythm, normal heart sounds and intact distal pulses.   No murmur heard. Pulmonary/Chest: Effort normal and breath sounds normal. She has no wheezes. She exhibits no tenderness.  Abdominal: Soft. Bowel sounds are normal.  Musculoskeletal: Normal range of motion. She exhibits no edema and no tenderness.  Lymphadenopathy:    She has no cervical adenopathy.  Neurological: She is alert and oriented to person, place, and time. She has normal reflexes. No cranial nerve deficit.  Skin: Skin is warm and dry. She is not diaphoretic.  Psychiatric: She has a normal mood and affect. Her behavior is normal.          Assessment & Plan:  Review meds  HTN stable IBS Joint pain  Chronic yeast on medications  DM risk on prednisone moniter IGE and food allergies

## 2012-02-16 NOTE — Patient Instructions (Signed)
The patient is instructed to continue all medications as prescribed. Schedule followup with check out clerk upon leaving the clinic  

## 2012-02-17 LAB — ALLERGEN FOOD PROFILE SPECIFIC IGE
Corn: <0.1 kU/L
Fish Cod: <0.1 kU/L
IgE (Immunoglobulin E), Serum: 158.2 IU/mL (ref 0.0–180.0)
Peanut IgE: 0.12 kU/L — ABNORMAL HIGH
Soybean IgE: <0.1 kU/L
Wheat IgE: 0.11 kU/L — ABNORMAL HIGH

## 2012-05-17 ENCOUNTER — Ambulatory Visit: Payer: Medicare Other | Admitting: Internal Medicine

## 2012-06-28 ENCOUNTER — Telehealth: Payer: Self-pay | Admitting: Internal Medicine

## 2012-06-28 ENCOUNTER — Ambulatory Visit (INDEPENDENT_AMBULATORY_CARE_PROVIDER_SITE_OTHER): Payer: Medicare Other | Admitting: Family Medicine

## 2012-06-28 ENCOUNTER — Encounter: Payer: Self-pay | Admitting: Family Medicine

## 2012-06-28 VITALS — BP 124/80 | Temp 98.6°F | Wt 179.0 lb

## 2012-06-28 DIAGNOSIS — R252 Cramp and spasm: Secondary | ICD-10-CM

## 2012-06-28 NOTE — Telephone Encounter (Signed)
Attempted to return call regarding leg cramps and fibromyalgia.  Unable to reach patient with identified message left on voicemail.

## 2012-06-28 NOTE — Telephone Encounter (Signed)
Patient Information:  Caller Name: [place]  Phone: [phone]  Patient: Amanda Pham, Amanda Pham  Gender: Female  DOB: May 30, 1940  Age: 72 Years  PCP: Darryll Capers (Adults only)  Office Follow Up:  Does the office need to follow up with this patient?: No  Instructions For The Office: N/A  RN Note:  Denies leg injuries or edema. Currently, left leg (thigh and calf) and hip is "tender" and sore. Noted muscle spasm in arm while fixing hair in past 2 days. Can feels lumps in leg muscles.   Symptoms  Reason For Call & Symptoms: Emergent Call: Reports leg cramps from fibromylagia for years;  began having severe, bilateral leg cramps for past 3 weeks that became worse in last 4 days.  Has at least 6-7 cramping episodes at night when leg muscles draw up; legs remain sore during the day.  Becomes diaphoretic with leg pain.  Cramping follows 5 days of diarrhea, now resolved.  Reviewed Health History In EMR: Yes  Reviewed Medications In EMR: Yes  Reviewed Allergies In EMR: Yes  Reviewed Surgeries / Procedures: Yes  Date of Onset of Symptoms: 06/07/2012  Treatments Tried: stretching legs, otc natural leg medication, Calcium, Magnesium,  Treatments Tried Worked: No  Guideline(s) Used:  Leg Pain  Disposition Per Guideline:   See Today or Tomorrow in Office  Reason For Disposition Reached:   Patient wants to be seen  Advice Given:  N/A  RN Overrode Recommendation:  Make Appointment  Agreed to see MD  Appointment Scheduled:  06/28/2012 13:45:00 Appointment Scheduled Provider:  Kriste Basque (Family Practice)

## 2012-06-28 NOTE — Progress Notes (Signed)
Chief Complaint  Patient presents with  . severe leg cramps    leg is sore; at night     HPI:  Acute visit for muscle cramps: -long history of fibro, arthritis - followed by rheum -chronic cramps in muscles, flare last several weeks - cramps in bilat legs at night - feels like needs to move legs when it happens, feels like drawing in legs, also has had cramps in arms -couldn't get in with PCP -she is actually seeing rheum for this tomorrow - prefers to get labs with them -also followed by a neurologist -denies: fevers, chills, weight loss, SOB, HA, malaise   ROS: See pertinent positives and negatives per HPI.  Past Medical History  Diagnosis Date  . Hypertension   . IBS (irritable bowel syndrome)   . Fibromyalgia   . Arthritis   . Knee osteonecrosis   . Hyperlipidemia   . Osteopenia     No family history on file.  History   Social History  . Marital Status: Married    Spouse Name: N/A    Number of Children: N/A  . Years of Education: N/A   Social History Main Topics  . Smoking status: Never Smoker   . Smokeless tobacco: None  . Alcohol Use: No  . Drug Use: No  . Sexually Active: None   Other Topics Concern  . None   Social History Narrative  . None    Current outpatient prescriptions:citalopram (CELEXA) 20 MG tablet, TAKE 1 TABLET ONCE DAILY, Disp: 90 tablet, Rfl: 3;  escitalopram (LEXAPRO) 10 MG tablet, Take 10 mg by mouth daily., Disp: , Rfl: ;  hyoscyamine (ANASPAZ) 0.125 MG TBDP, Place 0.125 mg under the tongue every 4 (four) hours as needed. , Disp: , Rfl: ;  leflunomide (ARAVA) 10 MG tablet, Take 10 mg by mouth daily., Disp: , Rfl:  levocetirizine (XYZAL) 5 MG tablet, Take 1 tablet (5 mg total) by mouth daily as needed., Disp: 30 tablet, Rfl: 11;  lisinopril-hydrochlorothiazide (PRINZIDE,ZESTORETIC) 20-25 MG per tablet, Take 1/2 tab twice a day, Disp: 10 tablet, Rfl: 1;  lisinopril-hydrochlorothiazide (PRINZIDE,ZESTORETIC) 20-25 MG per tablet, TAKE 1/2  TABLET TWICE A DAY, Disp: 90 tablet, Rfl: 1 omega-3 acid ethyl esters (LOVAZA) 1 G capsule, Take 2 g by mouth 2 (two) times daily.  , Disp: , Rfl: ;  omeprazole (PRILOSEC) 40 MG capsule, TAKE 1 CAPSULE (40 MG TOTAL) BY MOUTH DAILY., Disp: 30 capsule, Rfl: 5;  valACYclovir (VALTREX) 500 MG tablet, TAKE 1 TABLET THREE TIMES A DAY AS NEEDED, Disp: 30 tablet, Rfl: 0;  Vitamin D, Ergocalciferol, (DRISDOL) 50000 UNITS CAPS, TAKE 1 CAPSULE TWO TIMES A WEEK, Disp: 10 capsule, Rfl: 1 predniSONE (DELTASONE) 5 MG tablet, Take 5 mg by mouth daily., Disp: , Rfl:   EXAM:  Filed Vitals:   06/28/12 1406  BP: 124/80  Temp: 98.6 F (37 C)    Body mass index is 29.79 kg/(m^2).  GENERAL: vitals reviewed and listed above, alert, oriented, appears well hydrated and in no acute distress  HEENT: atraumatic, conjunttiva clear, no obvious abnormalities on inspection of external nose and ears  NECK: no obvious masses on inspection  LUNGS: clear to auscultation bilaterally, no wheezes, rales or rhonchi, good air movement  CV: HRRR, no peripheral edema  MS: moves all extremities without noticeable abnormality -gait normal, normal muscle strength throughout in bilat upper and lower extremities -normal sensation throughout -no fasciculations, normal DTRs, normal finger to nose and CN gorssly intact  PSYCH: pleasant and  cooperative, no obvious depression or anxiety  ASSESSMENT AND PLAN:  Discussed the following assessment and plan:  Muscle cramps  -discussed various etiologist and workup and would start with some lab work - she prefers to get lab work and work up with rheum tomorrow - and apologizes as almost cancelled this appt when found out she could get in with rheum -benign exam, advised supportive care, glass of tonic water tonight to see if helps and further eval with rheum tomorrow, follow up with PCP as needed -Patient advised to return or notify a doctor immediately if symptoms worsen or persist or  new concerns arise.  There are no Patient Instructions on file for this visit.   Kriste Basque R.

## 2012-07-20 ENCOUNTER — Other Ambulatory Visit: Payer: Self-pay | Admitting: Internal Medicine

## 2012-07-21 ENCOUNTER — Telehealth: Payer: Self-pay | Admitting: Nurse Practitioner

## 2012-07-21 NOTE — Telephone Encounter (Signed)
PLEASE FAX ORDER TO [id number]- [id number]

## 2012-07-21 NOTE — Telephone Encounter (Signed)
Form faxed to [facility] for mammogram.

## 2012-08-12 ENCOUNTER — Other Ambulatory Visit: Payer: Self-pay | Admitting: Internal Medicine

## 2012-09-08 ENCOUNTER — Ambulatory Visit (INDEPENDENT_AMBULATORY_CARE_PROVIDER_SITE_OTHER): Payer: Medicare Other | Admitting: Internal Medicine

## 2012-09-08 ENCOUNTER — Other Ambulatory Visit: Payer: Self-pay | Admitting: *Deleted

## 2012-09-08 ENCOUNTER — Encounter: Payer: Self-pay | Admitting: Internal Medicine

## 2012-09-08 VITALS — BP 130/80 | HR 72 | Temp 98.6°F | Resp 16 | Ht 65.0 in | Wt 176.0 lb

## 2012-09-08 DIAGNOSIS — M25572 Pain in left ankle and joints of left foot: Secondary | ICD-10-CM

## 2012-09-08 DIAGNOSIS — M358 Other specified systemic involvement of connective tissue: Secondary | ICD-10-CM

## 2012-09-08 DIAGNOSIS — E785 Hyperlipidemia, unspecified: Secondary | ICD-10-CM

## 2012-09-08 DIAGNOSIS — M351 Other overlap syndromes: Secondary | ICD-10-CM

## 2012-09-08 DIAGNOSIS — M069 Rheumatoid arthritis, unspecified: Secondary | ICD-10-CM

## 2012-09-08 DIAGNOSIS — D539 Nutritional anemia, unspecified: Secondary | ICD-10-CM

## 2012-09-08 DIAGNOSIS — M25579 Pain in unspecified ankle and joints of unspecified foot: Secondary | ICD-10-CM

## 2012-09-08 MED ORDER — DICLOFENAC SODIUM 1 % TD GEL: 2.0000 g | Freq: Three times a day (TID) | TRANSDERMAL | Status: DC

## 2012-09-08 MED ORDER — BUTALBITAL-APAP-CAFFEINE 50-325-40 MG PO TABS: 1.0000 | ORAL_TABLET | Freq: Four times a day (QID) | ORAL | Status: DC | PRN

## 2012-09-08 NOTE — Patient Instructions (Addendum)
Take the sublingual b12

## 2012-09-08 NOTE — Progress Notes (Signed)
Subjective:    Patient ID: Amanda Pham, female    DOB: [birth year], 72 y.o.   MRN: [id number]  HPI Patient has both RA and OA Pain levels high  Seeing rhematology "diabetic shoes" for shoe and foot pain Anemia? Endoscopy for chest pain and also had cardiology work up    Review of Systems  Constitutional: Negative for activity change, appetite change and fatigue.  HENT: Negative for ear pain, congestion, neck pain, postnasal drip and sinus pressure.   Eyes: Negative for redness and visual disturbance.  Respiratory: Positive for chest tightness and shortness of breath. Negative for cough and wheezing.   Gastrointestinal: Negative for abdominal pain and abdominal distention.  Genitourinary: Negative for dysuria, frequency and menstrual problem.  Musculoskeletal: Positive for myalgias and joint swelling. Negative for arthralgias.  Skin: Negative for rash and wound.  Neurological: Negative for dizziness, weakness and headaches.  Hematological: Negative for adenopathy. Does not bruise/bleed easily.  Psychiatric/Behavioral: Negative for sleep disturbance and decreased concentration.   Past Medical History  Diagnosis Date  . Hypertension   . IBS (irritable bowel syndrome)   . Fibromyalgia   . Arthritis   . Knee osteonecrosis   . Hyperlipidemia   . Osteopenia     History   Social History  . Marital Status: Married    Spouse Name: N/A    Number of Children: N/A  . Years of Education: N/A   Occupational History  . Not on file.   Social History Main Topics  . Smoking status: Never Smoker   . Smokeless tobacco: Not on file  . Alcohol Use: No  . Drug Use: No  . Sexually Active: Not on file   Other Topics Concern  . Not on file   Social History Narrative  . No narrative on file    Past Surgical History  Procedure Laterality Date  . Replacement total knee      No family history on file.  Allergies  Allergen Reactions  . Ivp Dye (Iodinated Diagnostic Agents)    . Penicillins   . Plaquenil (Hydroxychloroquine Sulfate)   . Sulfonamide Derivatives     Current Outpatient Prescriptions on File Prior to Visit  Medication Sig Dispense Refill  . escitalopram (LEXAPRO) 10 MG tablet TAKE 1 TABLET (10 MG TOTAL) BY MOUTH DAILY.  30 tablet  8  . hyoscyamine (ANASPAZ) 0.125 MG TBDP Place 0.125 mg under the tongue every 4 (four) hours as needed.       . leflunomide (ARAVA) 10 MG tablet Take 10 mg by mouth daily.      Marland Kitchen lisinopril-hydrochlorothiazide (PRINZIDE,ZESTORETIC) 20-25 MG per tablet TAKE 1/2 TABLET TWICE A DAY  90 tablet  1  . omeprazole (PRILOSEC) 40 MG capsule TAKE 1 CAPSULE (40 MG TOTAL) BY MOUTH DAILY.  30 capsule  5  . valACYclovir (VALTREX) 500 MG tablet TAKE 1 TABLET THREE TIMES A DAY AS NEEDED  30 tablet  2  . Vitamin D, Ergocalciferol, (DRISDOL) 50000 UNITS CAPS TAKE 1 CAPSULE TWO TIMES A WEEK  10 capsule  1   No current facility-administered medications on file prior to visit.    BP 130/80  Pulse 72  Temp(Src) 98.6 F (37 C)  Resp 16  Ht 5\' 5"  (1.651 m)  Wt 176 lb (79.833 kg)  BMI 29.29 kg/m2       Objective:   Physical Exam  Nursing note and vitals reviewed. Constitutional: She is oriented to person, place, and time. She appears well-developed and well-nourished. No distress.  HENT:  Head: Normocephalic and atraumatic.  Right Ear: External ear normal.  Left Ear: External ear normal.  Nose: Nose normal.  Mouth/Throat: Oropharynx is clear and moist.  Eyes: Conjunctivae and EOM are normal. Pupils are equal, round, and reactive to light.  Neck: Normal range of motion. Neck supple. No JVD present. No tracheal deviation present. No thyromegaly present.  Cardiovascular: Normal rate, regular rhythm, normal heart sounds and intact distal pulses.   No murmur heard. Pulmonary/Chest: Effort normal and breath sounds normal. She has no wheezes. She exhibits no tenderness.  Abdominal: Soft. Bowel sounds are normal.  Musculoskeletal:  Normal range of motion. She exhibits no edema and no tenderness.  Lymphadenopathy:    She has no cervical adenopathy.  Neurological: She is alert and oriented to person, place, and time. She has normal reflexes. No cranial nerve deficit.  Skin: Skin is warm and dry. She is not diaphoretic.  Psychiatric: She has a normal mood and affect. Her behavior is normal.          Assessment & Plan:  Diclofenac gel for foot pain Iron def anemia start fusion and b12 Increased joint pain Chronic pain

## 2012-09-13 ENCOUNTER — Telehealth: Payer: Self-pay | Admitting: Internal Medicine

## 2012-09-13 MED ORDER — FUSION PLUS PO CAPS: 1.0000 | ORAL_CAPSULE | Freq: Every day | ORAL | Status: DC

## 2012-09-13 NOTE — Telephone Encounter (Signed)
Pt brought fusion plus caps coupon to [facility]. Please sent rx into pharmacy

## 2012-09-13 NOTE — Telephone Encounter (Signed)
sent in to pharmacy

## 2012-09-15 ENCOUNTER — Other Ambulatory Visit: Payer: Self-pay

## 2012-09-21 ENCOUNTER — Telehealth: Payer: Self-pay | Admitting: Internal Medicine

## 2012-09-21 NOTE — Telephone Encounter (Signed)
Pharm following up on prior auth for diclofenac sodium (VOLTAREN) 1 % GEL

## 2012-09-24 NOTE — Telephone Encounter (Signed)
This med is approved. Pharmacy is aware.

## 2012-10-05 ENCOUNTER — Encounter: Payer: Self-pay | Admitting: Nurse Practitioner

## 2012-10-05 ENCOUNTER — Ambulatory Visit (INDEPENDENT_AMBULATORY_CARE_PROVIDER_SITE_OTHER): Payer: Medicare Other | Admitting: Nurse Practitioner

## 2012-10-05 VITALS — BP 130/78 | HR 72 | Ht 65.0 in | Wt 174.0 lb

## 2012-10-05 DIAGNOSIS — B9689 Other specified bacterial agents as the cause of diseases classified elsewhere: Secondary | ICD-10-CM

## 2012-10-05 DIAGNOSIS — A499 Bacterial infection, unspecified: Secondary | ICD-10-CM

## 2012-10-05 DIAGNOSIS — N760 Acute vaginitis: Secondary | ICD-10-CM

## 2012-10-05 LAB — POCT URINALYSIS DIPSTICK
Bilirubin, UA: NEGATIVE
Glucose, UA: NEGATIVE
Ketones, UA: NEGATIVE
Leukocytes, UA: NEGATIVE
Nitrite, UA: NEGATIVE

## 2012-10-05 MED ORDER — METRONIDAZOLE 0.75 % VA GEL: 1.0000 | Freq: Every day | VAGINAL | Status: DC

## 2012-10-05 MED ORDER — FLUCONAZOLE 150 MG PO TABS: 150.0000 mg | ORAL_TABLET | Freq: Once | ORAL | Status: DC

## 2012-10-05 NOTE — Progress Notes (Signed)
Subjective:     Patient ID: Amanda Pham, female   DOB: 1940-05-13, 72 y.o.   MRN: [id number]  HPI  72 yo WS Fe presents with symptoms of vaginal irritation since taking her Orencia IV.  She has noted this several times since on this medication that she gets symptoms within a few days.  She has already taken Diflucan 150 mg X 1 dose.  Because of this med has also reduced sugar and white bread from her diet. Now she has a lot of external irritation and burning.   Review of Systems  Constitutional: Negative for fever, chills, appetite change, fatigue and unexpected weight change.  HENT: Negative.   Respiratory: Negative.   Cardiovascular: Negative.   Gastrointestinal: Negative.  Negative for nausea, abdominal pain, diarrhea, constipation, blood in stool and anal bleeding.  Genitourinary: Positive for vaginal discharge and vaginal pain. Negative for dysuria, urgency, frequency, hematuria, decreased urine volume, enuresis and pelvic pain.  Neurological: Negative.   Psychiatric/Behavioral: Negative.        Objective:   Physical Exam  Constitutional: She is oriented to person, place, and time. She appears well-developed and well-nourished.  Cardiovascular: Normal rate.   Pulmonary/Chest: Effort normal and breath sounds normal.  Abdominal: Soft. She exhibits no distension and no mass. There is no tenderness. There is no rebound and no guarding.  Genitourinary:  Thin white vaginal discharge with areas of redness along the vaginal walls. Wet prep:  PH 5.0, NSS: + clue cells and RBC's, KOH negative.  Neurological: She is alert and oriented to person, place, and time.  Psychiatric: She has a normal mood and affect. Her behavior is normal. Judgment and thought content normal.       Assessment:      BV   History of yeast vaginitis following injection of Orencia Plan:     Metrogel vaginal cream HS X 5 Follow with Diflucan 150 mg  She will check with rheumatologist about preventive  treatment for yeast with anti fungal on the day of Orencia or the next day.

## 2012-10-05 NOTE — Patient Instructions (Addendum)
Vaginitis Vaginitis is an inflammation of the vagina. It is most often caused by a change in the normal balance of the bacteria and yeast that live in the vagina. This change in balance causes an overgrowth of certain bacteria or yeast, which causes the inflammation. There are different types of vaginitis, but the most common types are:  Bacterial vaginosis.  Yeast infection (candidiasis).  Trichomoniasis vaginitis. This is a sexually transmitted infection (STI).  Viral vaginitis.  Atropic vaginitis.  Allergic vaginitis. CAUSES  The cause depends on the type of vaginitis. Vaginitis can be caused by:  Bacteria (bacterial vaginosis).  Yeast (yeast infection).  A parasite (trichomoniasis vaginitis)  A virus (viral vaginitis).  Low hormone levels (atrophic vaginitis). Low hormone levels can occur during pregnancy, breastfeeding, or after menopause.  Irritants, such as bubble baths, scented tampons, and feminine sprays (allergic vaginitis). Other factors can change the normal balance of the yeast and bacteria that live in the vagina. These include:  Antibiotic medicines.  Poor hygiene.  Diaphragms, vaginal sponges, spermicides, birth control pills, and intrauterine devices (IUD).  Sexual intercourse.  Infection.  Uncontrolled diabetes.  A weakened immune system. SYMPTOMS  Symptoms can vary depending on the cause of the vaginitis. Common symptoms include:  Abnormal vaginal discharge.  The discharge is white, gray, or yellow with bacterial vaginosis.  The discharge is thick, white, and cheesy with a yeast infection.  The discharge is frothy and yellow or greenish with trichomoniasis.  A bad vaginal odor.  The odor is fishy with bacterial vaginosis.  Vaginal itching, pain, or swelling.  Painful intercourse.  Pain or burning when urinating. Sometimes, there are no symptoms. TREATMENT  Treatment will vary depending on the type of infection.   Bacterial  vaginosis and trichomoniasis are often treated with antibiotic creams or pills.  Yeast infections are often treated with antifungal medicines, such as vaginal creams or suppositories.  Viral vaginitis has no cure, but symptoms can be treated with medicines that relieve discomfort. Your sexual partner should be treated as well.  Atrophic vaginitis may be treated with an estrogen cream, pill, suppository, or vaginal ring. If vaginal dryness occurs, lubricants and moisturizing creams may help. You may be told to avoid scented soaps, sprays, or douches.  Allergic vaginitis treatment involves quitting the use of the product that is causing the problem. Vaginal creams can be used to treat the symptoms. HOME CARE INSTRUCTIONS   Take all medicines as directed by your caregiver.  Keep your genital area clean and dry. Avoid soap and only rinse the area with water.  Avoid douching. It can remove the healthy bacteria in the vagina.  Do not use tampons or have sexual intercourse until your vaginitis has been treated. Use sanitary pads while you have vaginitis.  Wipe from front to back. This avoids the spread of bacteria from the rectum to the vagina.  Let air reach your genital area.  Wear cotton underwear to decrease moisture buildup.  Avoid wearing underwear while you sleep until your vaginitis is gone.  Avoid tight pants and underwear or nylons without a cotton panel.  Take off wet clothing (especially bathing suits) as soon as possible.  Use mild, non-scented products. Avoid using irritants, such as:  Scented feminine sprays.  Fabric softeners.  Scented detergents.  Scented tampons.  Scented soaps or bubble baths.  Practice safe sex and use condoms. Condoms may prevent the spread of trichomoniasis and viral vaginitis. SEEK MEDICAL CARE IF:   You have abdominal pain.  You   have a fever or persistent symptoms for more than 2 3 days.  You have a fever and your symptoms suddenly  get worse. Document Released: 11/24/2006 Document Revised: 10/22/2011 Document Reviewed: 07/10/2011 Exit[employer] Patient Information 2014 [employer].  

## 2012-10-06 ENCOUNTER — Other Ambulatory Visit: Payer: Self-pay | Admitting: Obstetrics and Gynecology

## 2012-10-06 ENCOUNTER — Telehealth: Payer: Self-pay | Admitting: Nurse Practitioner

## 2012-10-06 MED ORDER — METRONIDAZOLE 500 MG PO TABS: 500.0000 mg | ORAL_TABLET | Freq: Two times a day (BID) | ORAL | Status: DC

## 2012-10-06 NOTE — Telephone Encounter (Signed)
Patient was prescribed Metronidazole . Patient is allergic to the Propyne le Glycol. Please call her about this. It was RX.  For an infection.

## 2012-10-06 NOTE — Telephone Encounter (Signed)
Spoke with pt to advise PG is not here today, but that another provider will review and advise on an alternative med. Pt very allergic to propylene glycol in any form. Did not use the metronidazole at all after reading ingredient list.

## 2012-10-06 NOTE — Telephone Encounter (Signed)
I recommend the patient take Flagyl 500 mg po bid for 7 days instead of the Metrogel.  See Epic orders. Do not mix with ETOH.

## 2012-10-06 NOTE — Telephone Encounter (Signed)
LVM for pt re: BS advice on Flagyl. Pt advised not to mix with alcohol. Pt to call back with any further problems.

## 2012-10-08 NOTE — Telephone Encounter (Signed)
She still needs to complete all days of Flagyl.  But can also take the Diflucan she has on hand over the weekend if needed.

## 2012-10-08 NOTE — Telephone Encounter (Signed)
Spoke with pt about continuing Flagyl. Pt also has another diflucan tab she can take this weekend as needed. Advised pt to try Aveeno oatmeal bath, either as sitz bath to pour over herself or in a shallow tub bath. Pt agreeable.

## 2012-10-08 NOTE — Telephone Encounter (Signed)
Patient C/O that the medicine is not helping. Can you call in something else ?

## 2012-10-08 NOTE — Progress Notes (Signed)
Encounter reviewed by Dr. Tayt Moyers Silva.  

## 2012-10-08 NOTE — Telephone Encounter (Signed)
Spoke with pt who has taken the flagyl for 2 days and is hasn't helped any. Still with vaginal irritation. Pt concerned with long weekend coming up that she will be uncomfortable. Any other advice?

## 2012-10-12 ENCOUNTER — Ambulatory Visit (INDEPENDENT_AMBULATORY_CARE_PROVIDER_SITE_OTHER): Payer: Medicare Other | Admitting: Nurse Practitioner

## 2012-10-12 ENCOUNTER — Encounter: Payer: Self-pay | Admitting: Nurse Practitioner

## 2012-10-12 VITALS — BP 130/84 | HR 68 | Ht 65.0 in | Wt 175.0 lb

## 2012-10-12 DIAGNOSIS — N3289 Other specified disorders of bladder: Secondary | ICD-10-CM

## 2012-10-12 DIAGNOSIS — K6289 Other specified diseases of anus and rectum: Secondary | ICD-10-CM

## 2012-10-12 LAB — POCT URINALYSIS DIPSTICK
Leukocytes, UA: NEGATIVE
Nitrite, UA: NEGATIVE
Protein, UA: NEGATIVE
Urobilinogen, UA: NEGATIVE

## 2012-10-12 NOTE — Patient Instructions (Signed)
Use creams at home for comfort.  - call back with name of meds.

## 2012-10-12 NOTE — Telephone Encounter (Signed)
Pt calling back with the name of the medication. Hydrocortisone cream 2.5%

## 2012-10-12 NOTE — Progress Notes (Signed)
72 y.o. Married Caucasian female.G3, P3. here for follow up of vaginal and rectal irritation  treated with Flagyl  initiated on 8/27 and followed with Diflucan. Completed all medication as directed.  Denies any symptoms of vaginal discharge or itching. She does have 1 tablet left of the Flagyl and is having burning of the rectal area after a bowel movement.  Feels like she is on' fire'.  She does have a history of  rectal fissure but that is not flared.  She does have lower abdominal pressure. No fever or chills and no urinary symptoms other than pressure.    O: Healthy WD,WN female Affect: anxious about persistent symptoms. Skin: normal Abdomen:soft, non tender, normal bowel sounds Pelvic exam:EXTERNAL GENITALIA: normal appearing vulva with no masses, tenderness or lesions. Maybe slight redness around the rectum. A rectal culture for yeast is obtained. VAGINA: no lesions or discharge. Rectum: redness without evidence of a fissure Urine specimen is normal.  A: BV symptoms and Yeast symptoms resolved  Peri rectal irritation perhaps secondary to  Flagyl and maybe yeast.  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 Farrel Gobble - will get yeast culture from the rectum and follow.  In the interim to take probiotics and yogurt daily   Labs : rectal culture for yeast  Instructions given regarding: use of cream that she has on hand at home for external irritation and she will need a refill. To call us back with that med's so that we can refill.  RTO prn

## 2012-10-13 NOTE — Telephone Encounter (Signed)
Spoke with pt about hydrocortisone cream 2.5%. Pt reports PG wanted the name of the cream she was on so she could call it in.

## 2012-10-15 ENCOUNTER — Other Ambulatory Visit: Payer: Self-pay | Admitting: Nurse Practitioner

## 2012-10-15 MED ORDER — HYDROCORTISONE 2.5 % EX CREA: TOPICAL_CREAM | Freq: Two times a day (BID) | CUTANEOUS | Status: DC

## 2012-10-15 NOTE — Telephone Encounter (Signed)
Can you sent to Pharmacy with [facility] and refill. Thanks

## 2012-10-15 NOTE — Telephone Encounter (Signed)
Ok to refill her hydrocortisone cream 2.5 % to use sparingly prn.

## 2012-10-15 NOTE — Telephone Encounter (Signed)
Patient went to Pharmacy yesterday to pick up Hydrocortisone 2.5% cream, it was not sent to pharmacy. I told her that you were not in the office yesterday and we will send it to her pharmacy today.

## 2012-10-16 NOTE — Progress Notes (Signed)
Encounter reviewed by Dr. Kennieth Plotts Silva.  

## 2012-10-19 ENCOUNTER — Other Ambulatory Visit: Payer: Self-pay | Admitting: Orthopedic Surgery

## 2012-11-01 NOTE — Telephone Encounter (Signed)
Pt calling to to get results from when she came in for a bacterial infection.

## 2012-11-02 NOTE — Telephone Encounter (Signed)
Spoke with pt about results. Preliminary result in Epic shows no yeast growth after 4 weeks. Pt states she is feeling better.

## 2012-11-09 LAB — FUNGUS CULTURE W SMEAR: Smear Result: NONE SEEN

## 2012-11-29 ENCOUNTER — Other Ambulatory Visit: Payer: Self-pay | Admitting: Internal Medicine

## 2012-11-30 ENCOUNTER — Encounter: Payer: Self-pay | Admitting: Nurse Practitioner

## 2012-11-30 ENCOUNTER — Ambulatory Visit (INDEPENDENT_AMBULATORY_CARE_PROVIDER_SITE_OTHER): Payer: Medicare Other | Admitting: Nurse Practitioner

## 2012-11-30 VITALS — BP 120/64 | HR 76 | Ht 65.0 in | Wt 172.0 lb

## 2012-11-30 DIAGNOSIS — B373 Candidiasis of vulva and vagina: Secondary | ICD-10-CM

## 2012-11-30 MED ORDER — NYSTATIN-TRIAMCINOLONE 100000-0.1 UNIT/GM-% EX OINT: TOPICAL_OINTMENT | Freq: Two times a day (BID) | CUTANEOUS | Status: DC

## 2012-11-30 MED ORDER — HYDROCORTISONE ACE-PRAMOXINE 2.5-1 % EX CREA: 1.0000 mg | TOPICAL_CREAM | Freq: Three times a day (TID) | CUTANEOUS | Status: DC

## 2012-11-30 MED ORDER — FLUCONAZOLE 150 MG PO TABS: 150.0000 mg | ORAL_TABLET | Freq: Once | ORAL | Status: DC

## 2012-11-30 NOTE — Progress Notes (Signed)
Subjective:     Patient ID: Amanda Pham, female   DOB: 08-08-1940, 72 y.o.   MRN: [id number]  HPI  This 72 yo WM Fe presents with history of yeast vaginitis symptoms.  Her symptoms seem to be externally with radiation to her perianal area.  She took Diflucan on Friday and again on Monday.  Symptoms are some better but wants to make sure all gone before her next IV infusion of Orencia next week.  This last infusion she did take a tablet of Diflucan the day after Orencia and no yeast symptoms.  Her Rheumatologist now agrees that she needs this as a treatment dose after the IV infusion.  She is not allowed to take the infusion if yeast symptoms present.  She still has flare of her anal fissure and needs a refill on Proctofoam.  She has an allergy to propylene glycol which is in this product but it does help her fissure to heal.  We did do a rectal culture for yeast that took a month to report, and it was negative for yeast.  She denies any urinary symptoms. She has had a bout 10 guest at her house this past week. Children and grandchildren.  So currently feels very tired and having increase in myalgias and muscle spasm.  She also got a flu shot 2 weeks ago and maybe that has contributed to her myalgia symptoms.  Review of Systems  Constitutional: Positive for fatigue. Negative for fever, chills and unexpected weight change.  HENT: Negative.   Respiratory: Negative.   Cardiovascular: Negative.   Gastrointestinal: Negative.  Negative for nausea, vomiting, abdominal pain, diarrhea, constipation, blood in stool and anal bleeding.  Genitourinary: Negative for dysuria, urgency, frequency, hematuria, flank pain, vaginal bleeding, vaginal discharge, pelvic pain and dyspareunia.       Vaginal an perianal itching  Musculoskeletal: Positive for arthralgias, back pain, joint swelling and myalgias.  Neurological: Negative.   Psychiatric/Behavioral: Negative.  Negative for behavioral problems, confusion, dysphoric  mood and agitation. The patient is not nervous/anxious.        Objective:   Physical Exam  Constitutional: She appears well-developed and well-nourished. She appears distressed.  She had a bad muscle spasm in her upper right leg while here.  Cardiovascular: Normal rate.   Pulmonary/Chest: Effort normal.  Abdominal: Soft. She exhibits no distension and no mass. There is no tenderness. There is no rebound and no guarding.  Genitourinary:  White thin vaginal discharge. No evidence of chronic vulvar changes of yeast.  Perianal area is irritated, but one area of the fissure at 11:00 of the anus is also red.  Wet prep: ph 5.0; NSS: neg; KOH: few yeast.       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

## 2012-12-05 NOTE — Progress Notes (Signed)
Encounter reviewed by Dr. Ezekeil Bethel Silva.  

## 2012-12-16 ENCOUNTER — Other Ambulatory Visit: Payer: Self-pay

## 2012-12-20 ENCOUNTER — Encounter: Payer: Self-pay | Admitting: Internal Medicine

## 2012-12-20 ENCOUNTER — Ambulatory Visit (INDEPENDENT_AMBULATORY_CARE_PROVIDER_SITE_OTHER): Payer: Medicare Other | Admitting: Internal Medicine

## 2012-12-20 VITALS — BP 116/80 | HR 76 | Temp 98.3°F | Resp 16 | Ht 65.0 in | Wt 177.0 lb

## 2012-12-20 DIAGNOSIS — T7840XA Allergy, unspecified, initial encounter: Secondary | ICD-10-CM

## 2012-12-20 DIAGNOSIS — D899 Disorder involving the immune mechanism, unspecified: Secondary | ICD-10-CM

## 2012-12-20 DIAGNOSIS — I10 Essential (primary) hypertension: Secondary | ICD-10-CM

## 2012-12-20 DIAGNOSIS — E785 Hyperlipidemia, unspecified: Secondary | ICD-10-CM

## 2012-12-20 DIAGNOSIS — M069 Rheumatoid arthritis, unspecified: Secondary | ICD-10-CM

## 2012-12-20 MED ORDER — FLUCONAZOLE 150 MG PO TABS: 150.0000 mg | ORAL_TABLET | Freq: Once | ORAL | Status: DC

## 2012-12-20 NOTE — Progress Notes (Signed)
  Subjective:    Patient ID: Amanda Pham, female    DOB: 02/14/1940, 72 y.o.   MRN: [id number]  HPI Had a flair of RA and was placed on cortisone. Only on for 2-3 days and has noted improvement. Also had the infusion of her RA drug. Increased pain and swelling Still on Arava. 30 day low dose  Discussion of the orencia and yeast infections... Taking a diflucan after the treatment   Review of Systems  Constitutional: Positive for fatigue. Negative for activity change and appetite change.  HENT: Negative for congestion, ear pain, postnasal drip and sinus pressure.   Eyes: Negative for redness and visual disturbance.  Respiratory: Negative for cough, shortness of breath and wheezing.   Gastrointestinal: Negative for abdominal pain and abdominal distention.  Genitourinary: Negative for dysuria, frequency and menstrual problem.  Musculoskeletal: Positive for arthralgias, joint swelling and neck pain. Negative for myalgias.  Skin: Negative for rash and wound.  Neurological: Negative for dizziness, weakness and headaches.  Hematological: Negative for adenopathy. Does not bruise/bleed easily.  Psychiatric/Behavioral: Negative for sleep disturbance and decreased concentration.       Objective:   Physical Exam  Constitutional: She is oriented to person, place, and time. She appears well-developed and well-nourished. No distress.  HENT:  Head: Normocephalic and atraumatic.  Eyes: Conjunctivae and EOM are normal. Pupils are equal, round, and reactive to light.  Neck: Normal range of motion. Neck supple. No JVD present. No tracheal deviation present. No thyromegaly present.  Cardiovascular: Normal rate and regular rhythm.   No murmur heard. Pulmonary/Chest: Effort normal and breath sounds normal. She has no wheezes. She exhibits no tenderness.  Abdominal: Soft. Bowel sounds are normal.  Musculoskeletal: Normal range of motion. She exhibits no edema and no tenderness.  Lymphadenopathy:   She has no cervical adenopathy.  Neurological: She is alert and oriented to person, place, and time. She has normal reflexes. No cranial nerve deficit.  Skin: Skin is warm and dry. She is not diaphoretic.  Psychiatric: She has a normal mood and affect. Her behavior is normal.          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

## 2013-01-19 ENCOUNTER — Other Ambulatory Visit: Payer: Self-pay | Admitting: *Deleted

## 2013-01-19 ENCOUNTER — Telehealth: Payer: Self-pay | Admitting: Internal Medicine

## 2013-01-19 MED ORDER — AZITHROMYCIN 250 MG PO TABS: ORAL_TABLET | ORAL | Status: DC

## 2013-01-19 NOTE — Telephone Encounter (Signed)
Per Cornell Barman, RN  Patient Information: Caller Name: Sharmayne Phone: [phone] Patient: Amanda Pham, Amanda Pham Gender: Female DOB: 02-07-1941 Age: 72 Years PCP: Darryll Capers (Adults only)  Office Follow Up: Does the office need to follow up with this patient?: Yes Instructions For The Office: Pharmacy [facility]. Requesting a Zpack . She states that Dr. Lovell Sheehan normally calls in medication. She is already on prednisone and Orenica. PLEASE CONTACT PATIENT REGARDING REQUEST FOR RX  RN Note: Pharmacy [facility]. Requesting a Zpack . Declines apt. She states that Dr. Lovell Sheehan normally calls in medication. She is already on prednisone and Orenica. PLEASE CONTACT PATIENT REGARDING REQUEST FOR RX  Symptoms Reason For Call & Symptoms: Patient states she is on Orencia and immune suppressant  for her arthritis. She states that Dr. Lovell Sheehan normally sends her antibiotic without being seen.  She states she normally gets a Zpack.  Onset of symptoms x2 -3 days.  Sinus drainage, throat is scratchy, +cough productive yellow.  Temperature 100.0 Reviewed Health History In EMR: Yes Reviewed Medications In EMR: Yes Reviewed Allergies In EMR: Yes Reviewed Surgeries / Procedures: Yes Date of Onset of Symptoms: 01/16/2013 Treatments Tried: Tylenol, Xyzol Treatments Tried Worked: No Any Fever: Yes Fever Taken: Oral Fever Time Of Reading: 12:00:00 Fever Last Reading: 100.0  Guideline(s) Used: Sinus Pain and Congestion  Disposition Per Guideline:   See Today in Office  Reason For Disposition Reached:   Sinus pain (not just congestion) and fever  Advice Given: For a Runny Nose With Profuse Discharge: Nasal mucus and discharge helps to wash viruses and bacteria out of the nose and sinuses. Blowing the nose is all that is needed. Hydration: Drink plenty of liquids (6-8 glasses of water daily). If the air in your home is dry, use a cool mist humidifier Pain and Fever Medicines: For pain or fever  relief, take either acetaminophen or ibuprofen. They are over-the-counter (OTC) drugs that help treat both fever and pain. You can buy them at the drugstore. Acetaminophen (e.g., Tylenol): Regular Strength Tylenol: Take 650 mg (two 325 mg pills) by mouth every 4-6 hours as needed. Each Regular Strength Tylenol pill has 325 mg of acetaminophen. Extra Strength Tylenol: Take 1,000 mg (two 500 mg pills) every 8 hours as needed. Each Extra Strength Tylenol pill has 500 mg of acetaminophen.  RN Overrode Recommendation: Patient Requests Prescription Pharmacy [facility]. Requesting a Zpack . She states that Dr. Lovell Sheehan normally calls in medication. She is already on prednisone and Orenica. PLEASE CONTACT PATIENT REGARDING REQUEST FOR RX

## 2013-01-19 NOTE — Telephone Encounter (Signed)
Per dr jenkins-may have z pack 

## 2013-01-25 ENCOUNTER — Encounter: Payer: Self-pay | Admitting: *Deleted

## 2013-01-27 ENCOUNTER — Ambulatory Visit: Payer: Self-pay | Admitting: Nurse Practitioner

## 2013-01-27 ENCOUNTER — Telehealth: Payer: Self-pay | Admitting: Nurse Practitioner

## 2013-01-27 NOTE — Telephone Encounter (Signed)
Patient canceled aex appointment today with Shirlyn Goltz. Patient's husband was in the hospital over night. Patient rescheduled to 02/08/13 @3 :45.

## 2013-02-08 ENCOUNTER — Ambulatory Visit (INDEPENDENT_AMBULATORY_CARE_PROVIDER_SITE_OTHER): Payer: Medicare Other | Admitting: Nurse Practitioner

## 2013-02-08 ENCOUNTER — Encounter: Payer: Self-pay | Admitting: Nurse Practitioner

## 2013-02-08 VITALS — BP 124/62 | HR 80 | Ht 64.75 in | Wt 167.0 lb

## 2013-02-08 DIAGNOSIS — E559 Vitamin D deficiency, unspecified: Secondary | ICD-10-CM

## 2013-02-08 DIAGNOSIS — Z01419 Encounter for gynecological examination (general) (routine) without abnormal findings: Secondary | ICD-10-CM

## 2013-02-08 DIAGNOSIS — B373 Candidiasis of vulva and vagina: Secondary | ICD-10-CM

## 2013-02-08 MED ORDER — VITAMIN D (ERGOCALCIFEROL) 1.25 MG (50000 UNIT) PO CAPS: 50000.0000 [IU] | ORAL_CAPSULE | ORAL | Status: DC

## 2013-02-08 NOTE — Progress Notes (Signed)
Patient ID: Amanda Pham, female   DOB: [birth year], 72 y.o.   MRN: [id number] 72 y.o. [id number] Married Caucasian Fe here for annual exam.  Had 2 courses of antibiotics for URI.  Now due for Orencia in 3 weeks.  Rheumatologist is now giving her Diflucan weekly to help suppress the yeast she gets with Orencia.  She took one tablet this am and still has some vaginal burning and discomfort.  Has triamcinolone and nystatin at home.  Today is her [place] Ouch and the family is taking her out to dinner!  Husband also with recent health issues and had to have cardiac stent.  No LMP recorded. Patient is postmenopausal.          Sexually active: yes  The current method of family planning is status post hysterectomy.    Exercising: yes  Hospital gym, walking. Smoker:  no  Health Maintenance: Pap:  09/24/09, WNL MMG:  07/21/12, Bi-Rads 1: negative Colonoscopy: 06/12/09, sigmoid diverticulitis, repeat 5 years BMD:  07/21/12, osteoporosis TDaP:  Allergy to tetanus Shingles: 12/07/10 Labs:  HB: PCP Urine: negative, pH 6.0   reports that she has never smoked. She does not have any smokeless tobacco history on file. She reports that she does not drink alcohol or use illicit drugs.  Past Medical History  Diagnosis Date  . Hypertension   . IBS (irritable bowel syndrome)   . Fibromyalgia   . Arthritis   . Knee osteonecrosis   . Hyperlipidemia   . Osteopenia     Past Surgical History  Procedure Laterality Date  . 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 Total abdominal hysterectomy  1978  . Bilateral salpingoophorectomy Bilateral 1979    endometriosis  . Carpal tunnel release Right 1989  . Colonoscopy w/ biopsies  06/12/09    sigmoid diverticulitis  . Esophagogastroduodenoscopy endoscopy  06/12/09    gastric polyps X 25  . Esophagogastroduodenoscopy endoscopy  08/14/09    benign fundal polyps - no adenoma    Current Outpatient Prescriptions  Medication Sig  Dispense Refill  . Abatacept (ORENCIA IV) Inject into the vein every 30 (thirty) days.      Marland Kitchen azithromycin (ZITHROMAX Z-PAK) 250 MG tablet Take 2 today and 1 daily until completed  6 each  0  . cholecalciferol (VITAMIN D) 1000 UNITS tablet Take 1,000 Units by mouth daily.      . diclofenac sodium (VOLTAREN) 1 % GEL Apply 2 g topically 3 (three) times daily.  100 g  3  . escitalopram (LEXAPRO) 10 MG tablet TAKE 1 TABLET (10 MG TOTAL) BY MOUTH DAILY.  30 tablet  8  . fluconazole (DIFLUCAN) 150 MG tablet Take 1 tablet (150 mg total) by mouth once.  1 tablet  11  . hydrocortisone 2.5 % cream Apply topically 2 (two) times daily.  30 g  0  . hyoscyamine (ANASPAZ) 0.125 MG TBDP Place 0.125 mg under the tongue every 4 (four) hours as needed.       . Iron-FA-B Cmp-C-Biot-Probiotic (FUSION PLUS) CAPS Take 1 capsule by mouth daily.  30 capsule  6  . leflunomide (ARAVA) 10 MG tablet Take 10 mg by mouth daily.      Marland Kitchen lisinopril-hydrochlorothiazide (PRINZIDE,ZESTORETIC) 20-25 MG per tablet TAKE 1/2 TABLET TWICE A DAY  90 tablet  0  . nystatin-triamcinolone ointment (MYCOLOG) Apply topically 2 (two) times daily.  60 g  1  . omeprazole (PRILOSEC) 40 MG capsule TAKE  1 CAPSULE (40 MG TOTAL) BY MOUTH DAILY.  30 capsule  5  . Pramoxine-HC (HYDROCORTISONE ACE-PRAMOXINE) 2.5-1 % CREA Apply 1 mg topically 3 (three) times daily.  10 g  3  . valACYclovir (VALTREX) 500 MG tablet TAKE 1 TABLET THREE TIMES A DAY AS NEEDED  30 tablet  2  . Vitamin D, Ergocalciferol, (DRISDOL) 50000 UNITS CAPS capsule        No current facility-administered medications for this visit.    Family History  Problem Relation Age of Onset  . Breast cancer Sister 1    DCIS, double mastectomy, neg chemo, neg nodes    ROS:  Pertinent items are noted in HPI.  Otherwise, a comprehensive ROS was negative.  Exam:   BP 124/62  Pulse 80  Ht 5' 4.75" (1.645 m)  Wt 167 lb (75.751 kg)  BMI 27.99 kg/m2 Height: 5' 4.75" (164.5 cm)  Ht Readings  from Last 3 Encounters:  02/08/13 5' 4.75" (1.645 m)  12/20/12 5\' 5"  (1.651 m)  11/30/12 5\' 5"  (1.651 m)    General appearance: alert, cooperative and appears stated age Head: Normocephalic, without obvious abnormality, atraumatic Neck: no adenopathy, supple, symmetrical, trachea midline and thyroid normal to inspection and palpation Lungs: clear to auscultation bilaterally Breasts: normal appearance, no masses or tenderness Heart: regular rate and rhythm Abdomen: soft, non-tender; no masses,  no organomegaly Extremities: extremities normal, atraumatic, no cyanosis or edema Skin: Skin color, texture, turgor normal. No rashes or lesions Lymph nodes: Cervical, supraclavicular, and axillary nodes normal. No abnormal inguinal nodes palpated Neurologic: Grossly normal   Pelvic: External genitalia:  no lesions, some areas of redness consistent with yeast. No internal vaginal discharge              Urethra:  normal appearing urethra with no masses, tenderness or lesions              Bartholin's and Skene's: normal                 Vagina: atrophic appearing vagina with pale color and no discharge, no lesions              Cervix: absent              Pap taken: no Bimanual Exam:  Uterus:  uterus absent              Adnexa: no mass, fullness, tenderness               Rectovaginal: deferred               Anus:  Deferred per request  A:  Well Woman with normal exam  S/P TAH/BSO 1978/1979 secondary to endometriosis  Postmenopausal ERT 1979- 2008  Atrophic vaginitis  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  Did not refill Estrace Vag cream at this time - not  needed yet  Counseled on breast self exam, mammography screening, adequate intake of calcium and vitamin D, diet and exercise, Kegel's exercises return annually or prn  An After Visit Summary was printed and given to the patient.

## 2013-02-08 NOTE — Progress Notes (Signed)
Encounter reviewed by Dr. Brook Silva.  

## 2013-02-08 NOTE — Patient Instructions (Signed)

## 2013-02-09 LAB — VITAMIN D 25 HYDROXY (VIT D DEFICIENCY, FRACTURES): Vit D, 25-Hydroxy: 34 ng/mL (ref 30–89)

## 2013-02-17 ENCOUNTER — Telehealth: Payer: Self-pay

## 2013-02-17 NOTE — Telephone Encounter (Signed)
lmtcb

## 2013-02-17 NOTE — Telephone Encounter (Signed)
Message copied by Robley Fries on Thu Feb 17, 2013  3:49 PM ------      Message from: Kem Boroughs R      Created: Fri Feb 11, 2013  8:48 AM       Let patient know that Vit D is a little better than in March.  Last December 2013 Vit D  was at 58, then in March went down to 26.  Now at 74 - still needs Vit D per protocol. ------

## 2013-02-18 NOTE — Telephone Encounter (Signed)
Patient is returning call to [facility] if she doesn't answer home # call cell

## 2013-03-04 NOTE — Telephone Encounter (Signed)
Patient aware of results.

## 2013-04-25 ENCOUNTER — Ambulatory Visit: Payer: Medicare Other | Admitting: Internal Medicine

## 2013-05-06 ENCOUNTER — Encounter: Payer: Self-pay | Admitting: Obstetrics & Gynecology

## 2013-05-06 ENCOUNTER — Ambulatory Visit (INDEPENDENT_AMBULATORY_CARE_PROVIDER_SITE_OTHER): Payer: Medicare Other | Admitting: Obstetrics & Gynecology

## 2013-05-06 VITALS — BP 128/62 | HR 64 | Resp 16 | Ht 64.5 in | Wt 169.2 lb

## 2013-05-06 DIAGNOSIS — L292 Pruritus vulvae: Secondary | ICD-10-CM

## 2013-05-06 DIAGNOSIS — D849 Immunodeficiency, unspecified: Secondary | ICD-10-CM

## 2013-05-06 DIAGNOSIS — L293 Anogenital pruritus, unspecified: Secondary | ICD-10-CM

## 2013-05-06 DIAGNOSIS — D899 Disorder involving the immune mechanism, unspecified: Secondary | ICD-10-CM

## 2013-05-06 MED ORDER — FLUCONAZOLE 150 MG PO TABS: 150.0000 mg | ORAL_TABLET | Freq: Once | ORAL | Status: DC

## 2013-05-06 NOTE — Patient Instructions (Signed)
Please call with any new issues.

## 2013-05-06 NOTE — Progress Notes (Signed)
Subjective:     Patient ID: Amanda Pham, female   DOB: 1940-11-18, 73 y.o.   MRN: [id number]  HPI 73 yo G4P3 MWF here with vulvar irritation for several days since last infusion for RA treatment.  Pt with long hx of recurrent yeast infections.  Has taken two Diflucan without success.  Pt is allergic to propylene glycol and has trouble with most OTC (and many prescription) creams.  Was afraid to try anything else so came for appt.    Having some stinging with urination but she feels this is due to skin irritation.  Also has a little vaginal odor but no real discharge.  Review of Systems  All other systems reviewed and are negative.       Objective:   Physical Exam  Constitutional: She is oriented to person, place, and time. She appears well-developed and well-nourished.  Abdominal: Soft. Bowel sounds are normal.  Genitourinary:    There is no rash, tenderness or lesion on the right labia. There is no rash, tenderness or lesion on the left labia. No tenderness or bleeding around the vagina. No foreign body around the vagina. No vaginal discharge found.  Cervix and uterus surgically absent.  Musculoskeletal: Normal range of motion.  Lymphadenopathy:       Right: No inguinal adenopathy present.       Left: No inguinal adenopathy present.  Neurological: She is alert and oriented to person, place, and time.  Skin: Skin is warm and dry.  Psychiatric: She has a normal mood and affect.   Wet smear:  Ph 4.5.  Saline with atropic epithelial cells and ++WBCs.  KOH with neg yeast.    Assessment:     Vulvar itching/excoriation     Plan:     Pt to use compounded estrace cream externally every three to four hours and apply vaseline, only, at night.  Can continue using vaginal estrace compounded cream twice weekly.  Affirm pending.  Doubtful of yeast.

## 2013-05-07 LAB — WET PREP BY MOLECULAR PROBE
Candida species: NEGATIVE
Gardnerella vaginalis: NEGATIVE
Trichomonas vaginosis: NEGATIVE

## 2013-05-10 ENCOUNTER — Telehealth: Payer: Self-pay | Admitting: Obstetrics & Gynecology

## 2013-05-10 NOTE — Telephone Encounter (Signed)
Spoke with patient. Message from Amanda Pham given as seen below. Patient states that she tried petroleum jelly as Dr.Miller recommended and it "Set me on fire. I can't use that any more." States that she is still using Estrace cream every night and that she feels "a little better." Itching has decreased but is still present, denies discharge, pain, fever, and any urinary symptoms. Advised would update Dr.Miller on how she is feeling and for her to call back if symptoms worsen or with any further concerns/questions. Patient verbalizes understanding and is agreeable.   Notes Recorded by Lyman Speller, MD on 05/09/2013 at 7:22 AM Inform no yeast is present. I released to mychart. I want to know how she is doing.

## 2013-05-10 NOTE — Telephone Encounter (Signed)
Patient is calling to see if the results are back from testing on friday

## 2013-05-13 NOTE — Telephone Encounter (Signed)
Encounter closed.  Will await further follow up from patient.

## 2013-05-20 ENCOUNTER — Other Ambulatory Visit: Payer: Self-pay | Admitting: Internal Medicine

## 2013-07-05 ENCOUNTER — Telehealth: Payer: Self-pay | Admitting: Internal Medicine

## 2013-07-05 NOTE — Telephone Encounter (Signed)
Pt upset that she is unable to reschuedule her appt schedued with you on 6/19 @ 245.  Pt states she has a paid vacation to be in [place] from 6/13-6/27.  Pt requesting to be worked in with you on a different day.  Pt offered an appt with another provider.  However, she declined stating she does not want to see any other providers in this practice as no one else has a background in chemistry like Dr. Arnoldo Morale.  Please advise if you are willing to work the patient in on another day.

## 2013-07-06 ENCOUNTER — Other Ambulatory Visit: Payer: Self-pay | Admitting: Internal Medicine

## 2013-07-20 NOTE — Telephone Encounter (Signed)
Pt has been scheduled w/ Dr Arnoldo Morale on August 29, 2013.  Pt very happy.

## 2013-07-20 NOTE — Telephone Encounter (Signed)
Best answer is to let her know that we will look for a cancelation

## 2013-07-29 ENCOUNTER — Ambulatory Visit: Payer: Medicare Other | Admitting: Internal Medicine

## 2013-08-29 ENCOUNTER — Encounter: Payer: Self-pay | Admitting: Internal Medicine

## 2013-08-29 ENCOUNTER — Ambulatory Visit (INDEPENDENT_AMBULATORY_CARE_PROVIDER_SITE_OTHER): Payer: Medicare Other | Admitting: Internal Medicine

## 2013-08-29 VITALS — BP 140/80 | HR 77 | Temp 98.7°F | Ht 65.0 in | Wt 176.0 lb

## 2013-08-29 DIAGNOSIS — Q619 Cystic kidney disease, unspecified: Secondary | ICD-10-CM

## 2013-08-29 DIAGNOSIS — D849 Immunodeficiency, unspecified: Secondary | ICD-10-CM

## 2013-08-29 DIAGNOSIS — M25579 Pain in unspecified ankle and joints of unspecified foot: Secondary | ICD-10-CM

## 2013-08-29 DIAGNOSIS — R252 Cramp and spasm: Secondary | ICD-10-CM

## 2013-08-29 DIAGNOSIS — D899 Disorder involving the immune mechanism, unspecified: Secondary | ICD-10-CM

## 2013-08-29 DIAGNOSIS — M25572 Pain in left ankle and joints of left foot: Secondary | ICD-10-CM

## 2013-08-29 DIAGNOSIS — N281 Cyst of kidney, acquired: Secondary | ICD-10-CM

## 2013-08-29 DIAGNOSIS — N83209 Unspecified ovarian cyst, unspecified side: Secondary | ICD-10-CM

## 2013-08-29 MED ORDER — VALACYCLOVIR HCL 500 MG PO TABS: ORAL_TABLET | ORAL | Status: AC

## 2013-08-29 MED ORDER — VITAMIN D (ERGOCALCIFEROL) 1.25 MG (50000 UNIT) PO CAPS: 50000.0000 [IU] | ORAL_CAPSULE | ORAL | Status: DC

## 2013-08-29 MED ORDER — FLUCONAZOLE 150 MG PO TABS: 150.0000 mg | ORAL_TABLET | Freq: Once | ORAL | Status: DC

## 2013-08-29 MED ORDER — DICLOFENAC SODIUM 1 % TD GEL: 2.0000 g | Freq: Three times a day (TID) | TRANSDERMAL | Status: DC

## 2013-08-29 MED ORDER — LISINOPRIL-HYDROCHLOROTHIAZIDE 20-25 MG PO TABS: ORAL_TABLET | ORAL | Status: DC

## 2013-08-29 MED ORDER — OMEPRAZOLE 40 MG PO CPDR: DELAYED_RELEASE_CAPSULE | ORAL | Status: DC

## 2013-08-29 MED ORDER — LEVOCETIRIZINE DIHYDROCHLORIDE 5 MG PO TABS: ORAL_TABLET | ORAL | Status: DC

## 2013-08-29 NOTE — Progress Notes (Signed)
Pre visit review using our clinic review tool, if applicable. No additional management support is needed unless otherwise documented below in the visit note. 

## 2013-08-29 NOTE — Progress Notes (Signed)
Subjective:    Patient ID: Amanda Pham, female    DOB: 07/22/1940, 73 y.o.   MRN: [id number]  HPI Follow up for CTD and seronegative arthritis Change in muscle spasms Strong in upper arms and legs     Review of Systems  Constitutional: Positive for fatigue.  Eyes: Negative.   Respiratory: Negative.   Cardiovascular: Negative.   Gastrointestinal: Negative.   Skin: Positive for pallor.  Neurological: Positive for weakness, numbness and headaches. Negative for tremors.   Past Medical History  Diagnosis Date  . Hypertension   . IBS (irritable bowel syndrome)   . Fibromyalgia   . Arthritis   . Knee osteonecrosis   . Hyperlipidemia   . Osteopenia     History   Social History  . Marital Status: Married    Spouse Name: N/A    Number of Children: 3  . Years of Education: N/A   Occupational History  .     Social History Main Topics  . Smoking status: Never Smoker   . Smokeless tobacco: Not on file  . Alcohol Use: No  . Drug Use: No  . Sexual Activity: Not on file   Other Topics Concern  . Not on file   Social History Narrative  . No narrative on file    Past Surgical History  Procedure Laterality Date  . 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 Total abdominal hysterectomy  1978  . Bilateral salpingoophorectomy Bilateral 1979    endometriosis  . Carpal tunnel release Right 1989  . Colonoscopy w/ biopsies  06/12/09    sigmoid diverticulitis  . Esophagogastroduodenoscopy endoscopy  06/12/09    gastric polyps X 25  . Esophagogastroduodenoscopy endoscopy  08/14/09    benign fundal polyps - no adenoma    Family History  Problem Relation Age of Onset  . Breast cancer Sister 87    DCIS, double mastectomy, neg chemo, neg nodes    Allergies  Allergen Reactions  . Propylene Glycol Other (See Comments)    Skin irritation topically. Derivatives in oral endo prep cause extreme projectile vomiting.  . Adhesive [Tape]    . Ivp Dye [Iodinated Diagnostic Agents]   . Penicillins   . Plaquenil [Hydroxychloroquine Sulfate]   . Sulfonamide Derivatives     Current Outpatient Prescriptions on File Prior to Visit  Medication Sig Dispense Refill  . cholecalciferol (VITAMIN D) 1000 UNITS tablet Take 1,000 Units by mouth daily.      . diclofenac sodium (VOLTAREN) 1 % GEL Apply 2 g topically 3 (three) times daily.  100 g  3  . escitalopram (LEXAPRO) 10 MG tablet TAKE 1 TABLET (10 MG TOTAL) BY MOUTH DAILY.  30 tablet  8  . fluconazole (DIFLUCAN) 150 MG tablet Take 1 tablet (150 mg total) by mouth once.  2 tablet  4  . hyoscyamine (ANASPAZ) 0.125 MG TBDP Place 0.125 mg under the tongue every 4 (four) hours as needed.       . leflunomide (ARAVA) 10 MG tablet Take 10 mg by mouth daily.      Marland Kitchen levocetirizine (XYZAL) 5 MG tablet TAKE 1 TABLET (5 MG TOTAL) BY MOUTH DAILY AS NEEDED.  30 tablet  0  . lisinopril-hydrochlorothiazide (PRINZIDE,ZESTORETIC) 20-25 MG per tablet TAKE 1/2 TABLET TWICE A DAY  90 tablet  0  . omeprazole (PRILOSEC) 40 MG capsule TAKE 1 CAPSULE (40 MG TOTAL) BY MOUTH DAILY.  30 capsule  5  . Tocilizumab (ACTEMRA IV) Inject into the vein. Every three weeks      . valACYclovir (VALTREX) 500 MG tablet TAKE 1 TABLET THREE TIMES A DAY AS NEEDED  30 tablet  2  . Vitamin D, Ergocalciferol, (DRISDOL) 50000 UNITS CAPS capsule Take 1 capsule (50,000 Units total) by mouth every 7 (seven) days.  30 capsule  2   No current facility-administered medications on file prior to visit.    BP 140/80  Pulse 77  Temp(Src) 98.7 F (37.1 C) (Oral)  Ht 5\' 5"  (1.651 m)  Wt 176 lb (79.833 kg)  BMI 29.29 kg/m2  SpO2 97%       Objective:   Physical Exam  Nursing note and vitals reviewed. Constitutional: She is oriented to person, place, and time.  HENT:  Head: Normocephalic and atraumatic.  Eyes: Conjunctivae are normal. Pupils are equal, round, and reactive to light.  Cardiovascular: Normal rate and regular rhythm.    Murmur heard. Pulmonary/Chest: Breath sounds normal.  Abdominal: Soft. Bowel sounds are normal.  Neurological: She is alert and oriented to person, place, and time.  Skin: Skin is warm and dry.  Psychiatric: She has a normal mood and affect. Her behavior is normal.          Assessment & Plan:  Increased cramping check lactic acid ,g and potassium levels Possible side effect of drugs vs spinal stenosis  Renal cyst.  [place]

## 2013-08-29 NOTE — Patient Instructions (Signed)
Dr Charlett Blake at [facility]

## 2013-08-30 LAB — LACTIC ACID, PLASMA: LACTIC ACID: 0.9 mmol/L (ref 0.5–2.2)

## 2013-08-30 LAB — MAGNESIUM: Magnesium: 2.2 mg/dL (ref 1.5–2.5)

## 2013-08-30 LAB — POTASSIUM: Potassium: 3.7 mEq/L (ref 3.5–5.1)

## 2013-08-31 ENCOUNTER — Ambulatory Visit
Admission: RE | Admit: 2013-08-31 | Discharge: 2013-08-31 | Disposition: A | Discharge status: Home / Self Care | Payer: Medicare Other | Source: Ambulatory Visit | Attending: Internal Medicine | Admitting: Internal Medicine

## 2013-08-31 DIAGNOSIS — N281 Cyst of kidney, acquired: Secondary | ICD-10-CM

## 2013-08-31 DIAGNOSIS — D849 Immunodeficiency, unspecified: Secondary | ICD-10-CM

## 2013-08-31 DIAGNOSIS — M25572 Pain in left ankle and joints of left foot: Secondary | ICD-10-CM

## 2013-08-31 DIAGNOSIS — D899 Disorder involving the immune mechanism, unspecified: Secondary | ICD-10-CM

## 2013-09-29 ENCOUNTER — Other Ambulatory Visit: Payer: Self-pay | Admitting: Internal Medicine

## 2013-11-22 ENCOUNTER — Telehealth: Payer: Self-pay | Admitting: Nurse Practitioner

## 2013-11-22 NOTE — Telephone Encounter (Signed)
Patient is asking for a refill of "vaginal cream".  Please call to [facility].  [phone]

## 2013-11-23 ENCOUNTER — Other Ambulatory Visit: Payer: Self-pay | Admitting: Obstetrics & Gynecology

## 2013-11-23 MED ORDER — NONFORMULARY OR COMPOUNDED ITEM: Status: DC

## 2013-11-23 NOTE — Telephone Encounter (Addendum)
[place].  Estradiol compound cream. Was refilled back in April. Rx called in today to last until 01/2014.

## 2013-11-25 ENCOUNTER — Other Ambulatory Visit: Payer: Self-pay

## 2013-12-02 ENCOUNTER — Encounter: Payer: Self-pay | Admitting: Nurse Practitioner

## 2013-12-02 ENCOUNTER — Telehealth: Payer: Self-pay | Admitting: Nurse Practitioner

## 2013-12-02 ENCOUNTER — Ambulatory Visit (INDEPENDENT_AMBULATORY_CARE_PROVIDER_SITE_OTHER): Payer: Medicare Other | Admitting: Nurse Practitioner

## 2013-12-02 VITALS — BP 120/70 | HR 64 | Ht 65.0 in | Wt 184.0 lb

## 2013-12-02 DIAGNOSIS — N952 Postmenopausal atrophic vaginitis: Secondary | ICD-10-CM

## 2013-12-02 MED ORDER — NONFORMULARY OR COMPOUNDED ITEM: Status: DC

## 2013-12-02 NOTE — Progress Notes (Signed)
73 y.o.Married Caucasian female [phone] with history of taking IV Tocilizumab for RA.  These injections are given every 3-6 weeks.  If she has any signs of a yeast infection she is not allowed to take the IV med's.  Most recently there have been two deaths in the family.  Older members but still a lot of estate work and family visiting from out of state.  Her daughter also moved in with their family of 5 for the furniture market.  With all that has gone on she has gotten off her schedule of using her vaginal estrogen therapy.  She is unsure is her symptoms are yeast related or dryness related.  She mostly just feels irritated externally and some at the introitus. Sexually active: Yes.   Last sexual activity was several weeks ago. Pt also reports no associated symptoms.  Patient has not tried over the counter treatment.     Exam:  OBS:[id number]                Vag:no lesions, wet prep done                Cx:  absent                Uterus:absent                Adnexa: no mass, fullness, tenderness  Wet Prep shows:PH: 4.0; KOH: no yeast; NSS: no clue cells.  There is a lot of atrophic changes   Dx: Atrophic vaginitis   TX: Will have her to continue with vaginal estrogen as directed.    She will use Aveeno bath if feels irritated  Ok to have treatment for RA  Due for AEX in January

## 2013-12-02 NOTE — Telephone Encounter (Signed)
Spoke with patient. Patient states that she was calling to check to see if her urine was check today at her appointment. "I have interstitial cystitis and these are symptoms of a bladder infection." Advised patient that I spoke with Milford Cage, FNP ans Colletta Maryland, CMA and urine was not checked today at [facility]. Advised patient could schedule to come in to drop urine off with nurse visit to have it checked. Advised of importance of having this checked.Patient declines appointment. "If I still have symptoms next week I will call to schedule an appointment."   Milford Cage, FNP okay to close encounter?

## 2013-12-02 NOTE — Telephone Encounter (Signed)
Patient wants know if she was checked for a bladder infection at her visit today with Waldemar Dickens, NP.

## 2013-12-02 NOTE — Patient Instructions (Signed)

## 2013-12-05 ENCOUNTER — Encounter: Payer: Self-pay | Admitting: Nurse Practitioner

## 2013-12-05 ENCOUNTER — Ambulatory Visit (INDEPENDENT_AMBULATORY_CARE_PROVIDER_SITE_OTHER): Payer: Medicare Other | Admitting: Nurse Practitioner

## 2013-12-05 VITALS — BP 124/60 | HR 80 | Temp 99.2°F | Resp 16 | Wt 181.0 lb

## 2013-12-05 DIAGNOSIS — R300 Dysuria: Secondary | ICD-10-CM

## 2013-12-05 MED ORDER — PHENAZOPYRIDINE HCL 200 MG PO TABS: 200.0000 mg | ORAL_TABLET | Freq: Three times a day (TID) | ORAL | Status: DC | PRN

## 2013-12-05 MED ORDER — NITROFURANTOIN MONOHYD MACRO 100 MG PO CAPS: 100.0000 mg | ORAL_CAPSULE | Freq: Two times a day (BID) | ORAL | Status: DC

## 2013-12-05 NOTE — Telephone Encounter (Signed)
Pt says she is still having bladder symptoms and think she may need to come in.

## 2013-12-05 NOTE — Telephone Encounter (Signed)
Spoke with patient. She is feeling like she is having bladder symptoms and or interstitial cystitis symptoms. She has not been feeling well this weekend.  Advised will need office visit for evaluation, patient advised office visit indicated for check of urine and follow up with Amanda Cage, FNP.  Patient was seen in office 12/02/13 dx atropic vaginitis.  Patient scheduled for today at 130 and is agreeable. Routing to provider for final review. Patient agreeable to disposition. Will close encounter

## 2013-12-05 NOTE — Progress Notes (Signed)
S:   73 y.o.Married Caucasian female presents with complaint of UTI. Symptoms began on  Friday after she was here for a possible yeast infection.  She thought maybe her IC was about to flare.  It did flare by the pm and has had urethral spasm an pain all weekend.  Denies fever and chills.  Associated symptoms of burning with urination, urinary frequency, urinary urgency, no increase in nocturia.The patient is having constitutional symptoms, including fatigue, malaise and urethral spasm.  Pyridium with little relief.   Sexually active yes but not recently SA.  She is  Menopausal.  Vaginal dryness is treated with estrogen vaginal cream.  Last UTI documented a year ago.  In the remote past she has been treated with DMSO for IC,  She has never been on [place]  ROS: feels ill  O: Alert, oriented to person, place, and time   Healthy,  alert and in mild distress  Mild lower abdominal tenderness  No CVA tenderness  Pelvic is not repeated due to possible casing a flare again   Diagnostic Test:    Urinalysis: chemstrip not done secondary to Pyridium   urine micro and urine C&S  Assessment: Maintain adequate hydration. Follow up if symptoms not improving, and as needed.     Plan:  Medication Therapy: Septra DS BID for a week   Follow with urine results   Discussed the possibility of needing another Uro consult   Lab:TOC if Urine Culture is positive

## 2013-12-05 NOTE — Progress Notes (Signed)
Encounter reviewed by Dr. Brook Silva.  

## 2013-12-06 LAB — URINALYSIS, MICROSCOPIC ONLY
Bacteria, UA: NONE SEEN
CRYSTALS: NONE SEEN
Casts: NONE SEEN

## 2013-12-07 ENCOUNTER — Telehealth: Payer: Self-pay

## 2013-12-07 LAB — URINE CULTURE
Colony Count: NO GROWTH
ORGANISM ID, BACTERIA: NO GROWTH

## 2013-12-07 NOTE — Telephone Encounter (Signed)
lmtcb

## 2013-12-07 NOTE — Telephone Encounter (Signed)
Message copied by Susy Manor on Wed Dec 07, 2013  1:07 PM ------      Message from: Regina Eck      Created: Wed Dec 07, 2013 12:45 PM       Notify patient that her urine culture was negative and micro only showed skin cells from collection. Complete medication but does not need TOC      Dl For PG ------

## 2013-12-07 NOTE — Telephone Encounter (Signed)
Patient notified see result note 

## 2013-12-07 NOTE — Progress Notes (Signed)
Encounter reviewed by Dr. Konni Kesinger Silva.  

## 2013-12-09 ENCOUNTER — Telehealth: Payer: Self-pay | Admitting: Nurse Practitioner

## 2013-12-09 MED ORDER — PHENAZOPYRIDINE HCL 200 MG PO TABS: 200.0000 mg | ORAL_TABLET | Freq: Three times a day (TID) | ORAL | Status: DC | PRN

## 2013-12-09 NOTE — Telephone Encounter (Signed)
Faxed request for additional quantity to be given.  Pt states to pharmacy she may use up to 4 times daily and will need 32 ml to last two months.  Per PGrubb, verbal OK to increase to 32 ml.  This is called to [facility] at [facility].  Also, fax received from [facility] to send Pyridium to local pharmacy instead of [facility].  [facility] sent to [facility].

## 2013-12-12 ENCOUNTER — Encounter: Payer: Self-pay | Admitting: Nurse Practitioner

## 2014-01-10 ENCOUNTER — Telehealth: Payer: Self-pay

## 2014-01-10 MED ORDER — LISINOPRIL-HYDROCHLOROTHIAZIDE 20-25 MG PO TABS
ORAL_TABLET | ORAL | Status: AC
Start: 2014-01-10 — End: ?

## 2014-01-10 NOTE — Telephone Encounter (Signed)
rx sent in electronically 

## 2014-01-10 NOTE — Telephone Encounter (Signed)
Rx request for Lisnopril-HCTZ 20-25 mg tablet- take 1/2 tablet twice daily. Pls advise.

## 2014-02-14 ENCOUNTER — Encounter: Payer: Self-pay | Admitting: Nurse Practitioner

## 2014-02-14 ENCOUNTER — Ambulatory Visit (INDEPENDENT_AMBULATORY_CARE_PROVIDER_SITE_OTHER): Payer: Medicare Other | Admitting: Nurse Practitioner

## 2014-02-14 VITALS — BP 130/74 | HR 60 | Ht 64.75 in | Wt 182.0 lb

## 2014-02-14 DIAGNOSIS — E559 Vitamin D deficiency, unspecified: Secondary | ICD-10-CM

## 2014-02-14 DIAGNOSIS — N898 Other specified noninflammatory disorders of vagina: Secondary | ICD-10-CM

## 2014-02-14 DIAGNOSIS — Z124 Encounter for screening for malignant neoplasm of cervix: Secondary | ICD-10-CM

## 2014-02-14 DIAGNOSIS — Z01419 Encounter for gynecological examination (general) (routine) without abnormal findings: Secondary | ICD-10-CM

## 2014-02-14 DIAGNOSIS — R3915 Urgency of urination: Secondary | ICD-10-CM

## 2014-02-14 DIAGNOSIS — Z0000 Encounter for general adult medical examination without abnormal findings: Secondary | ICD-10-CM

## 2014-02-14 DIAGNOSIS — D849 Immunodeficiency, unspecified: Secondary | ICD-10-CM

## 2014-02-14 DIAGNOSIS — D899 Disorder involving the immune mechanism, unspecified: Secondary | ICD-10-CM

## 2014-02-14 LAB — POCT URINALYSIS DIPSTICK
Bilirubin, UA: NEGATIVE
Blood, UA: NEGATIVE
GLUCOSE UA: NEGATIVE
Ketones, UA: NEGATIVE
LEUKOCYTES UA: NEGATIVE
NITRITE UA: NEGATIVE
Protein, UA: NEGATIVE
UROBILINOGEN UA: NEGATIVE
pH, UA: 6

## 2014-02-14 MED ORDER — PHENAZOPYRIDINE HCL 200 MG PO TABS: 200.0000 mg | ORAL_TABLET | Freq: Three times a day (TID) | ORAL | Status: DC | PRN

## 2014-02-14 MED ORDER — NONFORMULARY OR COMPOUNDED ITEM: Status: DC

## 2014-02-14 MED ORDER — VITAMIN D (ERGOCALCIFEROL) 1.25 MG (50000 UNIT) PO CAPS: 50000.0000 [IU] | ORAL_CAPSULE | ORAL | Status: DC

## 2014-02-14 MED ORDER — FLUCONAZOLE 150 MG PO TABS: 150.0000 mg | ORAL_TABLET | Freq: Once | ORAL | Status: DC

## 2014-02-14 NOTE — Progress Notes (Signed)
Rx for Estradiol Versabase 0.1 % cream printed and signed by Ms. Patty and faxed to [facility].

## 2014-02-14 NOTE — Progress Notes (Signed)
Patient ID: Amanda Pham, female   DOB: 05-Sep-1940, 74 y.o.   MRN: [id number] 74 y.o. [id number] Married Caucasian Fe here for annual exam. Doing well.  Sister is visiting and she celebrated her Rudene Anda on the 30 th of  December. She thought she was getting another yeast infection - so  IV treatment Orencia for RA is postponed to 15 th.  She did go ahead and take 1 Diflucan on Saturday.  Patient's last menstrual period was 02/11/1976.          Sexually active: yes  The current method of family planning is status post hysterectomy.  Exercising: yes Hospital gym, walking. Smoker: no  Health Maintenance: Pap: 09/24/09, WNL MMG: 07/21/12, Bi-Rads 1: negative scheduled for next week Colonoscopy: 06/12/09, sigmoid diverticulitis, repeat 5 years BMD: 07/21/12, osteoporosis last Reclast dose 11/2012 TDaP: Allergy to tetanus Shingles: 12/07/10 Prevnar 26 - not given  Labs: PCP, labs drawn for Vit D today Urine: negative  reports that she has never smoked. She does not have any smokeless tobacco history on file. She reports that she does not drink alcohol or use illicit drugs.  Past Medical History  Diagnosis Date  . Hypertension   . IBS (irritable bowel syndrome)   . Fibromyalgia   . Arthritis   . Knee osteonecrosis   . Hyperlipidemia   . Osteopenia     Past Surgical History  Procedure Laterality Date  . 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 Total abdominal hysterectomy  1978  . Bilateral salpingoophorectomy Bilateral 1979    endometriosis  . Carpal tunnel release Right 1989  . Colonoscopy w/ biopsies  06/12/09    sigmoid diverticulitis  . Esophagogastroduodenoscopy endoscopy  06/12/09    gastric polyps X 25  . Esophagogastroduodenoscopy endoscopy  08/14/09    benign fundal polyps - no adenoma    Current Outpatient Prescriptions  Medication Sig Dispense Refill  . cholecalciferol (VITAMIN D) 1000 UNITS tablet Take 1,000 Units by  mouth daily.    . diclofenac sodium (VOLTAREN) 1 % GEL Apply 2 g topically 3 (three) times daily. 100 g 3  . escitalopram (LEXAPRO) 10 MG tablet TAKE 1 TABLET (10 MG TOTAL) BY MOUTH DAILY. 30 tablet 10  . fluconazole (DIFLUCAN) 150 MG tablet Take 1 tablet (150 mg total) by mouth once. 2 tablet 4  . hyoscyamine (ANASPAZ) 0.125 MG TBDP Place 0.125 mg under the tongue every 4 (four) hours as needed.     . leflunomide (ARAVA) 10 MG tablet Take 10 mg by mouth daily.    Marland Kitchen levocetirizine (XYZAL) 5 MG tablet TAKE 1 TABLET (5 MG TOTAL) BY MOUTH DAILY AS NEEDED. 30 tablet 0  . lisinopril-hydrochlorothiazide (PRINZIDE,ZESTORETIC) 20-25 MG per tablet TAKE 1/2 TABLET TWICE A DAY 90 tablet 1  . LOTEMAX 0.5 % ophthalmic suspension Place 1 drop into both eyes 4 (four) times daily.  0  . NONFORMULARY OR COMPOUNDED ITEM Estradiol versabase (20ml) 0.1% (1mg /1ML) cream.  Insert one syringe vaginally qhs three weekly.  Disp:  19ml (two month supply) 4 each 0  . omeprazole (PRILOSEC) 40 MG capsule TAKE 1 CAPSULE (40 MG TOTAL) BY MOUTH DAILY. 30 capsule 5  . phenazopyridine (PYRIDIUM) 200 MG tablet Take 1 tablet (200 mg total) by mouth 3 (three) times daily as needed for pain. 30 tablet 1  . Tocilizumab (ACTEMRA IV) Inject into the vein. Every three weeks    . valACYclovir (VALTREX) 500 MG tablet  TAKE 1 TABLET THREE TIMES A DAY AS NEEDED 30 tablet 2  . Vitamin D, Ergocalciferol, (DRISDOL) 50000 UNITS CAPS capsule Take 1 capsule (50,000 Units total) by mouth every 7 (seven) days. 30 capsule 2   No current facility-administered medications for this visit.    Family History  Problem Relation Age of Onset  . Breast cancer Sister 44    DCIS, double mastectomy, neg chemo, neg nodes    ROS:  Pertinent items are noted in HPI.  Otherwise, a comprehensive ROS was negative.  Exam:   BP 130/74 mmHg  Pulse 60  Ht 5' 4.75" (1.645 m)  Wt 182 lb (82.555 kg)  BMI 30.51 kg/m2  LMP 02/11/1976 Height: 5' 4.75" (164.5 cm)  Ht  Readings from Last 3 Encounters:  02/14/14 5' 4.75" (1.645 m)  12/02/13 5\' 5"  (1.651 m)  08/29/13 5\' 5"  (1.651 m)    General appearance: alert, cooperative and appears stated age Head: Normocephalic, without obvious abnormality, atraumatic Neck: no adenopathy, supple, symmetrical, trachea midline and thyroid normal to inspection and palpation Lungs: clear to auscultation bilaterally Breasts: normal appearance, no masses or tenderness Heart: regular rate and rhythm Abdomen: soft, non-tender; no masses,  no organomegaly Extremities: extremities normal, atraumatic, no cyanosis or edema Skin: Skin color, texture, turgor normal. No rashes or lesions Lymph nodes: Cervical, supraclavicular, and axillary nodes normal. No abnormal inguinal nodes palpated Neurologic: Grossly normal   Pelvic: External genitalia:  no lesions              Urethra:  normal appearing urethra with no masses, tenderness or lesions              Bartholin's and Skene's: normal                 Vagina: normal appearing vagina with normal color and discharge, no lesions              Cervix: absent              Pap taken: No. 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

## 2014-02-14 NOTE — Patient Instructions (Signed)

## 2014-02-14 NOTE — Progress Notes (Signed)
Encounter reviewed by Dr. Brook Silva.  

## 2014-02-15 LAB — VITAMIN D 25 HYDROXY (VIT D DEFICIENCY, FRACTURES): VIT D 25 HYDROXY: 14 ng/mL — AB (ref 30–100)

## 2014-02-16 LAB — URINE CULTURE

## 2014-02-16 NOTE — Addendum Note (Signed)
Addended by: Graylon Good on: 02/16/2014 02:28 PM   Modules accepted: Miquel Dunn

## 2014-02-17 ENCOUNTER — Other Ambulatory Visit: Payer: Self-pay | Admitting: Nurse Practitioner

## 2014-02-17 MED ORDER — CLINDAMYCIN HCL 150 MG PO CAPS: 150.0000 mg | ORAL_CAPSULE | Freq: Three times a day (TID) | ORAL | Status: DC

## 2014-03-06 ENCOUNTER — Other Ambulatory Visit: Payer: Self-pay | Admitting: *Deleted

## 2014-03-06 MED ORDER — OMEPRAZOLE 40 MG PO CPDR: DELAYED_RELEASE_CAPSULE | ORAL | Status: AC

## 2014-05-17 ENCOUNTER — Other Ambulatory Visit: Payer: Self-pay

## 2014-05-24 ENCOUNTER — Other Ambulatory Visit (INDEPENDENT_AMBULATORY_CARE_PROVIDER_SITE_OTHER): Payer: Medicare Other

## 2014-05-24 DIAGNOSIS — E559 Vitamin D deficiency, unspecified: Secondary | ICD-10-CM

## 2014-05-26 LAB — VITAMIN D 25 HYDROXY (VIT D DEFICIENCY, FRACTURES): VIT D 25 HYDROXY: 14 ng/mL — AB (ref 30–100)

## 2014-08-07 ENCOUNTER — Other Ambulatory Visit: Payer: Self-pay

## 2014-09-08 ENCOUNTER — Telehealth: Payer: Self-pay | Admitting: Nurse Practitioner

## 2014-09-08 DIAGNOSIS — D899 Disorder involving the immune mechanism, unspecified: Principal | ICD-10-CM

## 2014-09-08 DIAGNOSIS — D849 Immunodeficiency, unspecified: Secondary | ICD-10-CM

## 2014-09-08 MED ORDER — FLUCONAZOLE 150 MG PO TABS: 150.0000 mg | ORAL_TABLET | Freq: Once | ORAL | Status: DC

## 2014-09-08 NOTE — Telephone Encounter (Signed)
Spoke with patient. Advised I have spoken with Milford Cage, FNP who states patient may have Diflucan 150 mg #2 1RF take one tablet and repeat in 48 hours if symptoms persist. Patient is agreeable and verbalizes understanding. [facility]  Milford Cage, FNP agree with recommendations?

## 2014-09-08 NOTE — Telephone Encounter (Signed)
Patient says she has a yeast infection but can't come into the office because husband has been really sick and she can't leave him.

## 2014-09-08 NOTE — Telephone Encounter (Signed)
Spoke with patient. Patient states that she began to have vaginal itching and white discharge a couple of days ago. States her husband has been in the hospital for fluid in his lungs which caused his heart to stop. Husband is now home and she is unable to leave him as he has to be continually monitored. Patient is requesting rx for Diflucan. Advised will speak with Milford Cage, FNP regarding symptoms and treatment and return call. Patient is agreeable.  Milford Cage, FNP okay for Diflucan 150 mg po x1 repeat in 48 hours if symptoms persist?

## 2014-09-08 NOTE — Telephone Encounter (Signed)
Agree with the plan. Will close the chart.

## 2014-10-05 ENCOUNTER — Telehealth: Payer: Self-pay | Admitting: *Deleted

## 2014-10-05 DIAGNOSIS — D899 Disorder involving the immune mechanism, unspecified: Principal | ICD-10-CM

## 2014-10-05 DIAGNOSIS — D849 Immunodeficiency, unspecified: Secondary | ICD-10-CM

## 2014-10-05 NOTE — Telephone Encounter (Signed)
Pt has lab appointment for 8/26 @ 2pm for repeat Vit D.  Pt states she had infusion yesterday, 8/24, and will need Diflucan RX to prevent yeast infection.  Pt states we usually call in three tablets.  [facility].  Pt aware Edman Circle, FNP out of office today and OK to wait until tomorrow for answer.

## 2014-10-05 NOTE — Telephone Encounter (Signed)
-----   Message from Kem Boroughs, [place] sent at 10/03/2014  5:45 PM EDT ----- Please give a call to schedule lab ----- Message -----    From: SYSTEM    Sent: 09/30/2014  12:04 AM      To: Kem Boroughs, FNP

## 2014-10-06 ENCOUNTER — Other Ambulatory Visit (INDEPENDENT_AMBULATORY_CARE_PROVIDER_SITE_OTHER): Payer: Medicare Other

## 2014-10-06 ENCOUNTER — Other Ambulatory Visit: Payer: Self-pay

## 2014-10-06 ENCOUNTER — Telehealth: Payer: Self-pay | Admitting: Nurse Practitioner

## 2014-10-06 DIAGNOSIS — E559 Vitamin D deficiency, unspecified: Secondary | ICD-10-CM

## 2014-10-06 MED ORDER — FLUCONAZOLE 150 MG PO TABS: 150.0000 mg | ORAL_TABLET | Freq: Once | ORAL | Status: DC

## 2014-10-06 NOTE — Telephone Encounter (Signed)
OK for Vit D to be done there.

## 2014-10-06 NOTE — Telephone Encounter (Signed)
Pt notified labs did not include Vit D.  She will come for labs today.  Routing to provider for final review.  Closing encounter.

## 2014-10-06 NOTE — Telephone Encounter (Signed)
Pt notified Diflucan has been called to the pharmacy.  Pt is very appreciative.    Pt states she had VitD checked at [facility] on Wednesday.  She has requested they fax those results to [place].  Advised pt I would look for these labs and let her know if Vit D was tested and if not we will call notify her.

## 2014-10-06 NOTE — Telephone Encounter (Signed)
Patient canceled her Vit D lab appointment today at 2:00pm. Patient says she had this done at [facility]. Patient will have results faxed.

## 2014-10-06 NOTE — Telephone Encounter (Signed)
OK for to get Diflucan 150 mg X 3 tab as directed.

## 2014-10-07 LAB — VITAMIN D 25 HYDROXY (VIT D DEFICIENCY, FRACTURES): Vit D, 25-Hydroxy: 13 ng/mL — ABNORMAL LOW (ref 30–100)

## 2014-10-26 ENCOUNTER — Telehealth: Payer: Self-pay | Admitting: *Deleted

## 2014-10-26 NOTE — Telephone Encounter (Signed)
-----   Message from Kem Boroughs, [place] sent at 10/09/2014  8:27 AM EDT ----- Results via my chart:  Please call her and discuss increase to 5000 IU OTC or 50,000 IU RX with a recheck in 3 months. She will feel better if her numbers are up.  [place],  The Vit D remains low at your OTC dose of 1000 IU daily - Colletta Maryland will call you with other treatment options.

## 2014-10-26 NOTE — Telephone Encounter (Signed)
Pt has not reviewed labs via [place].  I have attempted to contact this patient by phone with the following results: message left to return my call with spouse, Sonia Side per DPR.

## 2014-10-27 MED ORDER — VITAMIN D (ERGOCALCIFEROL) 1.25 MG (50000 UNIT) PO CAPS: 50000.0000 [IU] | ORAL_CAPSULE | ORAL | Status: AC

## 2014-10-27 NOTE — Telephone Encounter (Signed)
One per week for 12 weeks

## 2014-10-27 NOTE — Telephone Encounter (Signed)
Patient returned call. She is given message from [place], [place].  Patient states she has some prescription vitamin D at home and will begin to take it, but she is not sure. She is on her way to a doctors appointment.  Patty can you confirm she should receive 12 capsules, one per week for 12 weeks?

## 2014-10-30 NOTE — Telephone Encounter (Signed)
Order has been sent. Patient aware of instructions. Will close encounter.

## 2015-03-02 ENCOUNTER — Telehealth: Payer: Self-pay | Admitting: Nurse Practitioner

## 2015-03-02 NOTE — Telephone Encounter (Signed)
Please let patent know that BMD from 02/26/15 shows a T Score at spine -1.90; right hip neck -2.30; left hip neck -2.20.  Comparison study from 07/21/2012 show an increase in density at the right hip by 8%, but there was a loss at the left hip and spine by 7 %.  Looks like the Reclast from PCP has helped the right hip; which was the lowest marker.  She is now in the lower Osteopenia range.   She must maintain walking, weight bearing, calcium and Vit D support.  PCP also has a copy of this BMD and will have recommendations about further Reclast treatments.

## 2015-03-08 NOTE — Telephone Encounter (Signed)
Pt is notified of results.  She will wait for call from PCP office to discuss Reclast.  Pt was hoping for better scores.

## 2015-03-28 DIAGNOSIS — Z79899 Other long term (current) drug therapy: Secondary | ICD-10-CM

## 2015-03-28 DIAGNOSIS — M2550 Pain in unspecified joint: Secondary | ICD-10-CM

## 2015-03-28 HISTORY — DX: Other long term (current) drug therapy: Z79.899

## 2015-03-28 HISTORY — DX: Pain in unspecified joint: M25.50

## 2016-01-20 DIAGNOSIS — M810 Age-related osteoporosis without current pathological fracture: Secondary | ICD-10-CM | POA: Insufficient documentation

## 2016-01-20 HISTORY — DX: Age-related osteoporosis without current pathological fracture: M81.0

## 2016-10-05 DIAGNOSIS — M797 Fibromyalgia: Secondary | ICD-10-CM | POA: Insufficient documentation

## 2016-10-05 DIAGNOSIS — M545 Low back pain, unspecified: Secondary | ICD-10-CM

## 2016-10-05 HISTORY — DX: Low back pain, unspecified: M54.50

## 2016-12-05 ENCOUNTER — Ambulatory Visit
Admission: RE | Admit: 2016-12-05 | Discharge: 2016-12-05 | Disposition: A | Discharge status: Home / Self Care | Payer: BC Managed Care – PPO | Source: Ambulatory Visit | Attending: Cardiology | Admitting: Cardiology

## 2016-12-05 ENCOUNTER — Other Ambulatory Visit: Payer: Self-pay | Admitting: Cardiology

## 2016-12-05 DIAGNOSIS — R079 Chest pain, unspecified: Secondary | ICD-10-CM

## 2017-01-26 DIAGNOSIS — N183 Chronic kidney disease, stage 3 unspecified: Secondary | ICD-10-CM

## 2017-01-26 DIAGNOSIS — I129 Hypertensive chronic kidney disease with stage 1 through stage 4 chronic kidney disease, or unspecified chronic kidney disease: Secondary | ICD-10-CM

## 2017-01-26 DIAGNOSIS — K5730 Diverticulosis of large intestine without perforation or abscess without bleeding: Secondary | ICD-10-CM

## 2017-01-26 HISTORY — DX: Hypertensive chronic kidney disease with stage 1 through stage 4 chronic kidney disease, or unspecified chronic kidney disease: I12.9

## 2017-01-26 HISTORY — DX: Diverticulosis of large intestine without perforation or abscess without bleeding: K57.30

## 2017-01-26 HISTORY — DX: Chronic kidney disease, stage 3 unspecified: N18.30

## 2017-03-20 DIAGNOSIS — B009 Herpesviral infection, unspecified: Secondary | ICD-10-CM | POA: Insufficient documentation

## 2017-03-20 HISTORY — DX: Herpesviral infection, unspecified: B00.9

## 2017-04-03 ENCOUNTER — Emergency Department (HOSPITAL_COMMUNITY): Payer: Medicare Other

## 2017-04-03 ENCOUNTER — Other Ambulatory Visit: Payer: Self-pay

## 2017-04-03 ENCOUNTER — Encounter (HOSPITAL_COMMUNITY): Payer: Self-pay

## 2017-04-03 DIAGNOSIS — M25572 Pain in left ankle and joints of left foot: Secondary | ICD-10-CM | POA: Insufficient documentation

## 2017-04-03 DIAGNOSIS — Z5321 Procedure and treatment not carried out due to patient leaving prior to being seen by health care provider: Secondary | ICD-10-CM | POA: Diagnosis not present

## 2017-04-03 NOTE — ED Triage Notes (Signed)
Pt here for foot pain reports 4 wooden tv trays fell on her left foot. Foot is warmer to  Touch than right and red. Pt has extreme pain with any palpation of foot. Reports hx of RA and had taken 2 ibuprofen

## 2017-04-04 ENCOUNTER — Emergency Department (HOSPITAL_COMMUNITY)
Admission: EM | Admit: 2017-04-04 | Discharge: 2017-04-04 | Disposition: A | Discharge status: Home / Self Care | Payer: Medicare Other | Attending: Emergency Medicine | Admitting: Emergency Medicine

## 2017-04-04 ENCOUNTER — Encounter (HOSPITAL_COMMUNITY): Payer: Self-pay | Admitting: Emergency Medicine

## 2017-04-04 NOTE — ED Notes (Signed)
Pt states she is leaving due to wait.  Encouraged pt to stay and she declined.  Will follow-up with PCP.

## 2017-04-04 NOTE — ED Notes (Signed)
Pt states she is leaving.  Family states she will follow-up with orthopedic.

## 2017-04-04 NOTE — ED Notes (Signed)
Pt still here.  Family states she is unable to bear weight on ankle.  Encouraged pt to stay to be seen.

## 2017-05-04 ENCOUNTER — Emergency Department (HOSPITAL_COMMUNITY): Payer: Medicare Other

## 2017-05-04 ENCOUNTER — Encounter (HOSPITAL_COMMUNITY): Payer: Self-pay

## 2017-05-04 ENCOUNTER — Emergency Department (HOSPITAL_COMMUNITY)
Admission: EM | Admit: 2017-05-04 | Discharge: 2017-05-04 | Disposition: A | Discharge status: Home / Self Care | Payer: Medicare Other | Attending: Emergency Medicine | Admitting: Emergency Medicine

## 2017-05-04 ENCOUNTER — Other Ambulatory Visit: Payer: Self-pay

## 2017-05-04 DIAGNOSIS — Z5321 Procedure and treatment not carried out due to patient leaving prior to being seen by health care provider: Secondary | ICD-10-CM | POA: Diagnosis not present

## 2017-05-04 DIAGNOSIS — R079 Chest pain, unspecified: Secondary | ICD-10-CM | POA: Diagnosis present

## 2017-05-04 LAB — BASIC METABOLIC PANEL
Anion gap: 10 (ref 5–15)
BUN: 16 mg/dL (ref 6–20)
CALCIUM: 9.1 mg/dL (ref 8.9–10.3)
CO2: 25 mmol/L (ref 22–32)
CREATININE: 1.03 mg/dL — AB (ref 0.44–1.00)
Chloride: 103 mmol/L (ref 101–111)
GFR calc Af Amer: 60 mL/min — ABNORMAL LOW (ref >60)
GFR, EST NON AFRICAN AMERICAN: 51 mL/min — AB (ref >60)
GLUCOSE: 97 mg/dL (ref 65–99)
Potassium: 4.4 mmol/L (ref 3.5–5.1)
Sodium: 138 mmol/L (ref 135–145)

## 2017-05-04 LAB — CBC
HCT: 40.3 % (ref 36.0–46.0)
Hemoglobin: 13.5 g/dL (ref 12.0–15.0)
MCH: 29.2 pg (ref 26.0–34.0)
MCHC: 33.5 g/dL (ref 30.0–36.0)
MCV: 87 fL (ref 78.0–100.0)
Platelets: 268 10*3/uL (ref 150–400)
RBC: 4.63 MIL/uL (ref 3.87–5.11)
RDW: 13.2 % (ref 11.5–15.5)
WBC: 6.2 10*3/uL (ref 4.0–10.5)

## 2017-05-04 LAB — I-STAT TROPONIN, ED: TROPONIN I, POC: 0 ng/mL (ref 0.00–0.08)

## 2017-05-04 NOTE — ED Triage Notes (Addendum)
Pt presents to the ed with complaints of chest tightness during an IV treatment for rheumatoid arthritis.  States it was going down into her left arm.  Denies history. Pt then started having shortness of breath on the way home. She went to see her doctor and they sent her here. Received 4 sl nitro and decreased pain from a 6/10 to a 1/10

## 2017-05-04 NOTE — ED Triage Notes (Addendum)
PA Janit Bern requested that RN discharge pt AMA as pt wishes to leave.  She has informed pt of risks and reasons she would need to return.  Pt is planning on seeing Cardiac Provider tomorrow.  IV removed.

## 2017-05-05 ENCOUNTER — Encounter: Payer: Self-pay | Admitting: Cardiology

## 2017-05-05 NOTE — H&P (View-Only) (Signed)
Amanda Pham  Date of visit:  05/05/2017 DOB:  1940/10/02    Age:  77 yrs. Medical record number:  [id number]     Account number:  [id number] Primary Care Provider: Drake Leach ____________________________ CURRENT DIAGNOSES  1. Chest pain, unspecified  2. Hyperlipidemia  3. Hypertensive heart disease without heart failure  4. Gastro-esophageal reflux disease  5. 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 Actemra 200 mg/10 mL (20 mg/mL) intravenous solution, Q 4 weeks  2. amlodipine 2.5 mg tablet, 1 p.o. daily  3. hyoscyamine 0.125 mg sublingual tablet, PRN  4. levocetirizine 5 mg tablet, 1 p.o. daily PRN  5. Lexapro 10 mg tablet, 1 p.o. daily  6. lisinopril 20 mg-hydrochlorothiazide 12.5 mg tablet, 1/2 tab b.i.d.  7. nitroglycerin 0.4 mg sublingual tablet, take as directed  8. omeprazole 40 mg capsule,delayed release, 1 p.o. daily ____________________________ HISTORY OF PRESENT ILLNESS Patient seen for cardiac followup. Yesterday she was receiving an infusion of Actemra for rheumatoid arthritis and developed severe shortness of breath as well as chest tightness. Chest tightness persisted and she later went home and called the office and was advised to go to the emergency room. She went to the emergency room where an initial troponin was negative an EKG showed QT prolongation as well as nonspecific ST changes. She signed out against advice and had some vague chest discomfort after she returned home. She had been given nitroglycerin in route to the emergency room and also aspirin and noted  pain relief after the nitroglycerin. She has a previous history of chest discomfort. Her husband died earlier last year and she was having a TURP of time with breathing and had a myocardial perfusion scan that did not show ischemia but was clinically positive with ST depression and significant chest discomfort. At the time because of a previous contrast allergy we deferred consideration of catheterization but she has continued to have chest discomfort since then. It has many atypical features and she is difficult to assess. She had one episode of vague chest discomfort earlier today after she had been discharged from the emergency room. EKG today in the office showed improvement in her her QT prolongation as well as ST segments and was noted on previous EKG. ____________________________ PAST HISTORY  Past Medical Illnesses:  hypertension, hyperlipidemia, GERD, rheumatoid arthritis, irritable bowel syndrome;  Cardiovascular Illnesses:  no previous history of cardiac disease;  Infectious Diseases:  no previous history of significant infectious diseases;  Surgical Procedures:  hysterectomy, oophorectomy, carpal tunnel release, knee replacement-bil;  Trauma History:  no previous history of significant trauma;  NYHA Classification:  I;  Canadian Angina Classification:  Class 0: Asymptomatic;  Cardiology Procedures-Invasive:  no previous interventional or invasive cardiology procedures;  Cardiology Procedures-Noninvasive:  echocardiogram October 2018, lexiscan Myoview December 2018;  Peripheral Vascular Procedures:  no previous invasive peripheral vascular procedures.;  LVEF of 60% documented via echocardiogram on 01/15/2017,   ____________________________ CARDIO-PULMONARY TEST DATES EKG Date:  05/05/2017;  Nuclear Study Date:  01/15/2017;  Echocardiography Date: 12/08/2016;  Chest Xray Date: 12/05/2016;   ____________________________ FAMILY HISTORY Father -- Coronary Artery Disease, Father dead, Malignant  neoplasm of lung, COPD Mother -- Mother dead, COPD, Congestive heart failure, Angina, Myocardial infarction at less than 56 ____________________________ SOCIAL HISTORY Alcohol Use:  does not use alcohol;  Smoking:  nonsmoker;  Diet:  regular diet;  Lifestyle:  widowed, 1 daughter and 2 sons;  Exercise:  exercise is limited due to physical disability;  Residence:  lives alone;   ____________________________ REVIEW OF SYSTEMS General:  malaise and fatigue Eyes: wears eye glasses/contact lenses, cataracts Ears, Nose, Throat, Mouth:  partial hearing loss Respiratory: denies dyspnea, cough, wheezing or hemoptysis. Cardiovascular:  please review HPI Abdominal: history of GERD, waterbrash, diarrhea and history of irritable bowel syndromeGenitourinary-Female: dysuria, frequency, urgency, interstitial cystitis Musculoskeletal:  generalized arthritis, chronic back pain Psychiatric:  anxiety, situational stress  ____________________________ PHYSICAL EXAMINATION VITAL SIGNS  Blood Pressure:  140/64 Sitting, Right arm, large cuff  , 144/68 Standing, Right arm and large cuff   Pulse:  76/min. Weight:  178.00 lbs. Height:  65.00"BMI: 29  Constitutional:  pleasant white female in no acute distress, mildly obese Skin:  warm and dry to touch, no apparent skin lesions, or masses noted. Head:  normocephalic, normal hair pattern, no masses or tenderness Eyes:  EOMS Intact, PERRLA, C and S clear, Funduscopic exam not done. ENT:  ears, nose and throat reveal no gross abnormalities.  Dentition good. Neck:  supple, without massess. No JVD, thyromegaly or carotid bruits. Carotid upstroke normal. Chest:  normal symmetry, clear to auscultation. Cardiac:  regular rhythm, normal S1 and S2, No S3 or S4, no murmurs, gallops or rubs detected. Abdomen:  abdomen soft,non-tender, no masses, no hepatospenomegaly, or aneurysm noted Peripheral Pulses:  the femoral,dorsalis pedis, and posterior tibial pulses are full and equal  bilaterally with no bruits auscultated. Extremities & Back:  mild ulnar deviation of fingers, mild MCP thickening, no edema, no spinal abnormalities, scar from knee surgery bilaterally Neurological:  no gross motor or sensory deficits noted, affect appropriate, oriented x3. ____________________________ MOST RECENT LIPID PANEL 01/22/17  CHOL TOTL 199 mg/dl, LDL 139 NM, HDL 55 mg/dl, TRIGLYCER 132 mg/dl and CHOL/HDL 3.6 (Calc) ____________________________ IMPRESSIONS/PLAN  1. Ongoing chest discomfort in a patient with multiple risk factors (intolerance to statin drugs with known hyperlipidemia, family history and rheumatoid arthritis) 2. Hypertension 3. Hyperlipidemia with statin intolerance 4. Rheumatoid arthritis  Recommendations:  At this point she continues to have ongoing chest discomfort with an emergency room visit but she signed out against advice. Characteristics of the recent pain were associated with EKG changes and relief with nitroglycerin. I have recommended that she undergo cardiac catheterization. She does have a contrast allergy and she will be premedicated for this with steroids/antihistamines. Cardiac catheterization was discussed with the patient including risks of myocardial infarction, death, stroke, bleeding, arrhythmia, dye allergy, or renal insufficiency. She understands and is willing to proceed. Possibility of percutaneous intervention at the same setting was also discussed with the patient including risks. We'll arrange to be done by one of the doctors with [facility] medical group.  Greater than 40 minutes spent with greater than 50% of time spent face-to-face including coordination of care/counseling regarding workup of chest pain abnormal stress test and arrangements for catheterization.  Twelve-lead EKG personally reviewed by me today shows indeterminate axis, resolution of QT prolongation and previous nonspecific changes seen on EKG from ER  yesterday.  ____________________________ TODAYS ORDERS  1. Draw PT/INR: Today  2. PTT: Today  3. 12 Lead EKG: Today  4. Left Heart Cath/Poss PCI: At Patient [facility]ience                       ____________________________ Cardiology Physician:  Ezzard Standing,  Brooke Bonito MD [facility]

## 2017-05-05 NOTE — Progress Notes (Signed)
Amanda Pham  Date of visit:  05/05/2017 DOB:  1940/06/17    Age:  77 yrs. Medical record number:  [id number]     Account number:  [id number] Primary Care Provider: Drake Leach ____________________________ CURRENT DIAGNOSES  1. Chest pain, unspecified  2. Hyperlipidemia  3. Hypertensive heart disease without heart failure  4. Gastro-esophageal reflux disease  5. 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 Actemra 200 mg/10 mL (20 mg/mL) intravenous solution, Q 4 weeks  2. amlodipine 2.5 mg tablet, 1 p.o. daily  3. hyoscyamine 0.125 mg sublingual tablet, PRN  4. levocetirizine 5 mg tablet, 1 p.o. daily PRN  5. Lexapro 10 mg tablet, 1 p.o. daily  6. lisinopril 20 mg-hydrochlorothiazide 12.5 mg tablet, 1/2 tab b.i.d.  7. nitroglycerin 0.4 mg sublingual tablet, take as directed  8. omeprazole 40 mg capsule,delayed release, 1 p.o. daily ____________________________ HISTORY OF PRESENT ILLNESS Patient seen for cardiac followup. Yesterday she was receiving an infusion of Actemra for rheumatoid arthritis and developed severe shortness of breath as well as chest tightness. Chest tightness persisted and she later went home and called the office and was advised to go to the emergency room. She went to the emergency room where an initial troponin was negative an EKG showed QT prolongation as well as nonspecific ST changes. She signed out against advice and had some vague chest discomfort after she returned home. She had been given nitroglycerin in route to the emergency room and also aspirin and noted  pain relief after the nitroglycerin. She has a previous history of chest discomfort. Her husband died earlier last year and she was having a TURP of time with breathing and had a myocardial perfusion scan that did not show ischemia but was clinically positive with ST depression and significant chest discomfort. At the time because of a previous contrast allergy we deferred consideration of catheterization but she has continued to have chest discomfort since then. It has many atypical features and she is difficult to assess. She had one episode of vague chest discomfort earlier today after she had been discharged from the emergency room. EKG today in the office showed improvement in her her QT prolongation as well as ST segments and was noted on previous EKG. ____________________________ PAST HISTORY  Past Medical Illnesses:  hypertension, hyperlipidemia, GERD, rheumatoid arthritis, irritable bowel syndrome;  Cardiovascular Illnesses:  no previous history of cardiac disease;  Infectious Diseases:  no previous history of significant infectious diseases;  Surgical Procedures:  hysterectomy, oophorectomy, carpal tunnel release, knee replacement-bil;  Trauma History:  no previous history of significant trauma;  NYHA Classification:  I;  Canadian Angina Classification:  Class 0: Asymptomatic;  Cardiology Procedures-Invasive:  no previous interventional or invasive cardiology procedures;  Cardiology Procedures-Noninvasive:  echocardiogram October 2018, lexiscan Myoview December 2018;  Peripheral Vascular Procedures:  no previous invasive peripheral vascular procedures.;  LVEF of 60% documented via echocardiogram on 01/15/2017,   ____________________________ CARDIO-PULMONARY TEST DATES EKG Date:  05/05/2017;  Nuclear Study Date:  01/15/2017;  Echocardiography Date: 12/08/2016;  Chest Xray Date: 12/05/2016;   ____________________________ FAMILY HISTORY Father -- Coronary Artery Disease, Father dead, Malignant  neoplasm of lung, COPD Mother -- Mother dead, COPD, Congestive heart failure, Angina, Myocardial infarction at less than 37 ____________________________ SOCIAL HISTORY Alcohol Use:  does not use alcohol;  Smoking:  nonsmoker;  Diet:  regular diet;  Lifestyle:  widowed, 1 daughter and 2 sons;  Exercise:  exercise is limited due to physical disability;  Residence:  lives alone;   ____________________________ REVIEW OF SYSTEMS General:  malaise and fatigue Eyes: wears eye glasses/contact lenses, cataracts Ears, Nose, Throat, Mouth:  partial hearing loss Respiratory: denies dyspnea, cough, wheezing or hemoptysis. Cardiovascular:  please review HPI Abdominal: history of GERD, waterbrash, diarrhea and history of irritable bowel syndromeGenitourinary-Female: dysuria, frequency, urgency, interstitial cystitis Musculoskeletal:  generalized arthritis, chronic back pain Psychiatric:  anxiety, situational stress  ____________________________ PHYSICAL EXAMINATION VITAL SIGNS  Blood Pressure:  140/64 Sitting, Right arm, large cuff  , 144/68 Standing, Right arm and large cuff   Pulse:  76/min. Weight:  178.00 lbs. Height:  65.00"BMI: 29  Constitutional:  pleasant white female in no acute distress, mildly obese Skin:  warm and dry to touch, no apparent skin lesions, or masses noted. Head:  normocephalic, normal hair pattern, no masses or tenderness Eyes:  EOMS Intact, PERRLA, C and S clear, Funduscopic exam not done. ENT:  ears, nose and throat reveal no gross abnormalities.  Dentition good. Neck:  supple, without massess. No JVD, thyromegaly or carotid bruits. Carotid upstroke normal. Chest:  normal symmetry, clear to auscultation. Cardiac:  regular rhythm, normal S1 and S2, No S3 or S4, no murmurs, gallops or rubs detected. Abdomen:  abdomen soft,non-tender, no masses, no hepatospenomegaly, or aneurysm noted Peripheral Pulses:  the femoral,dorsalis pedis, and posterior tibial pulses are full and equal  bilaterally with no bruits auscultated. Extremities & Back:  mild ulnar deviation of fingers, mild MCP thickening, no edema, no spinal abnormalities, scar from knee surgery bilaterally Neurological:  no gross motor or sensory deficits noted, affect appropriate, oriented x3. ____________________________ MOST RECENT LIPID PANEL 01/22/17  CHOL TOTL 199 mg/dl, LDL 139 NM, HDL 55 mg/dl, TRIGLYCER 132 mg/dl and CHOL/HDL 3.6 (Calc) ____________________________ IMPRESSIONS/PLAN  1. Ongoing chest discomfort in a patient with multiple risk factors (intolerance to statin drugs with known hyperlipidemia, family history and rheumatoid arthritis) 2. Hypertension 3. Hyperlipidemia with statin intolerance 4. Rheumatoid arthritis  Recommendations:  At this point she continues to have ongoing chest discomfort with an emergency room visit but she signed out against advice. Characteristics of the recent pain were associated with EKG changes and relief with nitroglycerin. I have recommended that she undergo cardiac catheterization. She does have a contrast allergy and she will be premedicated for this with steroids/antihistamines. Cardiac catheterization was discussed with the patient including risks of myocardial infarction, death, stroke, bleeding, arrhythmia, dye allergy, or renal insufficiency. She understands and is willing to proceed. Possibility of percutaneous intervention at the same setting was also discussed with the patient including risks. We'll arrange to be done by one of the doctors with [facility] medical group.  Greater than 40 minutes spent with greater than 50% of time spent face-to-face including coordination of care/counseling regarding workup of chest pain abnormal stress test and arrangements for catheterization.  Twelve-lead EKG personally reviewed by me today shows indeterminate axis, resolution of QT prolongation and previous nonspecific changes seen on EKG from ER  yesterday.  ____________________________ TODAYS ORDERS  1. Draw PT/INR: Today  2. PTT: Today  3. 12 Lead EKG: Today  4. Left Heart Cath/Poss PCI: At Patient Convenience                       ____________________________ Cardiology Physician:  Ezzard Standing,  Brooke Bonito MD [facility]

## 2017-05-06 LAB — PROTIME-INR

## 2017-05-13 DIAGNOSIS — R079 Chest pain, unspecified: Secondary | ICD-10-CM

## 2017-05-13 HISTORY — DX: Chest pain, unspecified: R07.9

## 2017-05-15 ENCOUNTER — Ambulatory Visit (HOSPITAL_COMMUNITY)
Admission: RE | Admit: 2017-05-15 | Discharge: 2017-05-15 | Disposition: A | Discharge status: Home / Self Care | Payer: Medicare Other | Source: Ambulatory Visit | Attending: Interventional Cardiology | Admitting: Interventional Cardiology

## 2017-05-15 ENCOUNTER — Encounter (HOSPITAL_COMMUNITY)
Admission: RE | Disposition: A | Discharge status: Home / Self Care | Payer: Self-pay | Source: Ambulatory Visit | Attending: Interventional Cardiology

## 2017-05-15 DIAGNOSIS — R079 Chest pain, unspecified: Secondary | ICD-10-CM | POA: Diagnosis present

## 2017-05-15 DIAGNOSIS — K219 Gastro-esophageal reflux disease without esophagitis: Secondary | ICD-10-CM | POA: Insufficient documentation

## 2017-05-15 DIAGNOSIS — Z96653 Presence of artificial knee joint, bilateral: Secondary | ICD-10-CM | POA: Insufficient documentation

## 2017-05-15 DIAGNOSIS — I10 Essential (primary) hypertension: Secondary | ICD-10-CM | POA: Diagnosis present

## 2017-05-15 DIAGNOSIS — E785 Hyperlipidemia, unspecified: Secondary | ICD-10-CM | POA: Diagnosis not present

## 2017-05-15 DIAGNOSIS — I2510 Atherosclerotic heart disease of native coronary artery without angina pectoris: Secondary | ICD-10-CM | POA: Insufficient documentation

## 2017-05-15 DIAGNOSIS — Z79899 Other long term (current) drug therapy: Secondary | ICD-10-CM | POA: Insufficient documentation

## 2017-05-15 DIAGNOSIS — R072 Precordial pain: Secondary | ICD-10-CM | POA: Diagnosis not present

## 2017-05-15 DIAGNOSIS — M059 Rheumatoid arthritis with rheumatoid factor, unspecified: Secondary | ICD-10-CM | POA: Diagnosis not present

## 2017-05-15 DIAGNOSIS — I119 Hypertensive heart disease without heart failure: Secondary | ICD-10-CM | POA: Insufficient documentation

## 2017-05-15 DIAGNOSIS — Z880 Allergy status to penicillin: Secondary | ICD-10-CM | POA: Insufficient documentation

## 2017-05-15 DIAGNOSIS — Z882 Allergy status to sulfonamides status: Secondary | ICD-10-CM | POA: Insufficient documentation

## 2017-05-15 DIAGNOSIS — Z888 Allergy status to other drugs, medicaments and biological substances status: Secondary | ICD-10-CM | POA: Diagnosis not present

## 2017-05-15 HISTORY — PX: LEFT HEART CATH AND CORONARY ANGIOGRAPHY: CATH118249

## 2017-05-15 LAB — PROTIME-INR
INR: 1
PROTHROMBIN TIME: 13.1 s (ref 11.4–15.2)

## 2017-05-15 SURGERY — LEFT HEART CATH AND CORONARY ANGIOGRAPHY
Anesthesia: LOCAL

## 2017-05-15 MED ORDER — FAMOTIDINE IN NACL 20-0.9 MG/50ML-% IV SOLN
20.0000 mg | Freq: Once | INTRAVENOUS | Status: AC
  Administered 2017-05-15: 20 mg via INTRAVENOUS

## 2017-05-15 MED ORDER — ONDANSETRON HCL 4 MG/2ML IJ SOLN: 4.0000 mg | Freq: Four times a day (QID) | INTRAMUSCULAR | Status: DC | PRN

## 2017-05-15 MED ORDER — DIPHENHYDRAMINE HCL 25 MG PO CAPS: 50.0000 mg | ORAL_CAPSULE | Freq: Once | ORAL | Status: AC

## 2017-05-15 MED ORDER — LIDOCAINE HCL (PF) 1 % IJ SOLN
INTRAMUSCULAR | Status: DC | PRN
  Administered 2017-05-15: 2 mL

## 2017-05-15 MED ORDER — SODIUM CHLORIDE 0.9 % WEIGHT BASED INFUSION
3.0000 mL/kg/h | INTRAVENOUS | Status: AC
  Administered 2017-05-15: 3 mL/kg/h via INTRAVENOUS

## 2017-05-15 MED ORDER — SODIUM CHLORIDE 0.9 % IV SOLN: INTRAVENOUS | Status: DC

## 2017-05-15 MED ORDER — MIDAZOLAM HCL 2 MG/2ML IJ SOLN
INTRAMUSCULAR | Status: AC
  Filled 2017-05-15: qty 2

## 2017-05-15 MED ORDER — ASPIRIN 81 MG PO CHEW
81.0000 mg | CHEWABLE_TABLET | ORAL | Status: AC
  Administered 2017-05-15: 81 mg via ORAL

## 2017-05-15 MED ORDER — SODIUM CHLORIDE 0.9 % IV SOLN: 250.0000 mL | INTRAVENOUS | Status: DC | PRN

## 2017-05-15 MED ORDER — MIDAZOLAM HCL 2 MG/2ML IJ SOLN
INTRAMUSCULAR | Status: DC | PRN
  Administered 2017-05-15: 1 mg via INTRAVENOUS

## 2017-05-15 MED ORDER — SODIUM CHLORIDE 0.9% FLUSH: 3.0000 mL | INTRAVENOUS | Status: DC | PRN

## 2017-05-15 MED ORDER — FAMOTIDINE IN NACL 20-0.9 MG/50ML-% IV SOLN
INTRAVENOUS | Status: AC
  Administered 2017-05-15: 20 mg via INTRAVENOUS
  Filled 2017-05-15: qty 50

## 2017-05-15 MED ORDER — VERAPAMIL HCL 2.5 MG/ML IV SOLN
INTRAVENOUS | Status: DC | PRN
  Administered 2017-05-15: 10 mL via INTRA_ARTERIAL

## 2017-05-15 MED ORDER — DIPHENHYDRAMINE HCL 50 MG/ML IJ SOLN
50.0000 mg | Freq: Once | INTRAMUSCULAR | Status: AC
  Administered 2017-05-15: 50 mg via INTRAVENOUS

## 2017-05-15 MED ORDER — DIPHENHYDRAMINE HCL 50 MG/ML IJ SOLN
INTRAMUSCULAR | Status: AC
  Administered 2017-05-15: 50 mg via INTRAVENOUS
  Filled 2017-05-15: qty 1

## 2017-05-15 MED ORDER — ACETAMINOPHEN 325 MG PO TABS: 650.0000 mg | ORAL_TABLET | ORAL | Status: DC | PRN

## 2017-05-15 MED ORDER — FENTANYL CITRATE (PF) 100 MCG/2ML IJ SOLN
INTRAMUSCULAR | Status: AC
  Filled 2017-05-15: qty 2

## 2017-05-15 MED ORDER — IOHEXOL 350 MG/ML SOLN
INTRAVENOUS | Status: DC | PRN
  Administered 2017-05-15: 60 mL

## 2017-05-15 MED ORDER — SODIUM CHLORIDE 0.9% FLUSH: 3.0000 mL | Freq: Two times a day (BID) | INTRAVENOUS | Status: DC

## 2017-05-15 MED ORDER — HEPARIN (PORCINE) IN NACL 2-0.9 UNIT/ML-% IJ SOLN
INTRAMUSCULAR | Status: AC | PRN
  Administered 2017-05-15 (×2): 500 mL

## 2017-05-15 MED ORDER — FENTANYL CITRATE (PF) 100 MCG/2ML IJ SOLN
INTRAMUSCULAR | Status: DC | PRN
  Administered 2017-05-15: 50 ug via INTRAVENOUS

## 2017-05-15 MED ORDER — ASPIRIN 81 MG PO CHEW: 81.0000 mg | CHEWABLE_TABLET | Freq: Every day | ORAL | Status: DC

## 2017-05-15 MED ORDER — LIDOCAINE HCL (PF) 1 % IJ SOLN
INTRAMUSCULAR | Status: AC
  Filled 2017-05-15: qty 30

## 2017-05-15 MED ORDER — HEPARIN (PORCINE) IN NACL 2-0.9 UNIT/ML-% IJ SOLN
INTRAMUSCULAR | Status: AC
  Filled 2017-05-15: qty 1000

## 2017-05-15 MED ORDER — HEPARIN SODIUM (PORCINE) 1000 UNIT/ML IJ SOLN
INTRAMUSCULAR | Status: AC
  Filled 2017-05-15: qty 1

## 2017-05-15 MED ORDER — VERAPAMIL HCL 2.5 MG/ML IV SOLN
INTRAVENOUS | Status: AC
  Filled 2017-05-15: qty 2

## 2017-05-15 MED ORDER — SODIUM CHLORIDE 0.9 % WEIGHT BASED INFUSION: 1.0000 mL/kg/h | INTRAVENOUS | Status: DC

## 2017-05-15 MED ORDER — ASPIRIN 81 MG PO CHEW
CHEWABLE_TABLET | ORAL | Status: AC
  Administered 2017-05-15: 81 mg via ORAL
  Filled 2017-05-15: qty 1

## 2017-05-15 MED ORDER — HEPARIN SODIUM (PORCINE) 1000 UNIT/ML IJ SOLN
INTRAMUSCULAR | Status: DC | PRN
  Administered 2017-05-15: 4000 [IU] via INTRAVENOUS

## 2017-05-15 MED ORDER — SODIUM CHLORIDE 0.9% FLUSH
3.0000 mL | Freq: Two times a day (BID) | INTRAVENOUS | Status: DC
Start: 2017-05-15 — End: 2017-05-15

## 2017-05-15 SURGICAL SUPPLY — 13 items
BAND CMPR LRG ZPHR (HEMOSTASIS) ×1
BAND ZEPHYR COMPRESS 30 LONG (HEMOSTASIS) ×1 IMPLANT
CATH INFINITI 5 FR JL3.5 (CATHETERS) ×1 IMPLANT
CATH INFINITI JR4 5F (CATHETERS) ×1 IMPLANT
COVER PRB 48X5XTLSCP FOLD TPE (BAG) IMPLANT
COVER PROBE 5X48 (BAG) ×2
GLIDESHEATH SLEND A-KIT 6F 22G (SHEATH) ×1 IMPLANT
GUIDEWIRE INQWIRE 1.5J.035X260 (WIRE) IMPLANT
INQWIRE 1.5J .035X260CM (WIRE) ×2
KIT HEART LEFT (KITS) ×2 IMPLANT
PACK CARDIAC CATHETERIZATION (CUSTOM PROCEDURE TRAY) ×2 IMPLANT
TRANSDUCER W/STOPCOCK (MISCELLANEOUS) ×2 IMPLANT
TUBING CIL FLEX 10 FLL-RA (TUBING) ×2 IMPLANT

## 2017-05-15 NOTE — Research (Signed)
CADFEM Informed Consent   Subject Name: Amanda Pham  Subject met inclusion and exclusion criteria.  The informed consent form, study requirements and expectations were reviewed with the subject and questions and concerns were addressed prior to the signing of the consent form.  The subject verbalized understanding of the trail requirements.  The subject agreed to participate in the CADFEM trial and signed the informed consent.  The informed consent was obtained prior to performance of any protocol-specific procedures for the subject.  A copy of the signed informed consent was given to the subject and a copy was placed in the subject's medical record.  Christena Flake 05/15/2017, 12:35 PM

## 2017-05-15 NOTE — Interval H&P Note (Signed)
Cath Lab Visit (complete for each Cath Lab visit)  Clinical Evaluation Leading to the Procedure:   ACS: No.  Non-ACS:    Anginal Classification: CCS III  Anti-ischemic medical therapy: Minimal Therapy (1 class of medications)  Non-Invasive Test Results: Low-risk stress test findings: cardiac mortality <1%/year  Prior CABG: No previous CABG      History and Physical Interval Note:  05/15/2017 2:26 PM  Amanda Pham  has presented today for surgery, with the diagnosis of unstable angina  The various methods of treatment have been discussed with the patient and family. After consideration of risks, benefits and other options for treatment, the patient has consented to  Procedure(s): LEFT HEART CATH AND CORONARY ANGIOGRAPHY (N/A) as a surgical intervention .  The patient's history has been reviewed, patient examined, no change in status, stable for surgery.  I have reviewed the patient's chart and labs.  Questions were answered to the patient's satisfaction.     Belva Crome III

## 2017-05-15 NOTE — Discharge Instructions (Signed)

## 2017-05-18 ENCOUNTER — Encounter (HOSPITAL_COMMUNITY): Payer: Self-pay | Admitting: Interventional Cardiology

## 2017-05-18 MED FILL — Heparin Sodium (Porcine) 2 Unit/ML in Sodium Chloride 0.9%: INTRAMUSCULAR | Qty: 1000 | Status: AC

## 2017-09-16 DIAGNOSIS — M25531 Pain in right wrist: Secondary | ICD-10-CM

## 2017-09-16 HISTORY — DX: Pain in right wrist: M25.531

## 2018-02-07 DIAGNOSIS — I2510 Atherosclerotic heart disease of native coronary artery without angina pectoris: Secondary | ICD-10-CM | POA: Insufficient documentation

## 2018-02-07 DIAGNOSIS — I25119 Atherosclerotic heart disease of native coronary artery with unspecified angina pectoris: Secondary | ICD-10-CM | POA: Insufficient documentation

## 2018-02-07 HISTORY — DX: Atherosclerotic heart disease of native coronary artery without angina pectoris: I25.10

## 2018-02-07 NOTE — Progress Notes (Signed)
Cardiology Office Note:    Date:  02/08/2018   ID:  Amanda Pham, DOB [birth year], MRN [id number]  PCP:  Drake Leach, MD  Cardiologist:  Shirlee More, MD    Referring MD: No ref. provider found    ASSESSMENT:    1. Essential hypertension   2. Coronary artery disease involving native coronary artery of native heart with angina pectoris ([place])   3. Hyperlipidemia LDL goal <70    PLAN:    In order of problems listed above:  1. Poorly controlled with paroxysmal severe hypertension she will continue to survey her blood pressure at home given a prescription for clonidine she can take as needed greater than 254 systolic.  Check renal vascular duplex at risk for renal artery stenosis 2. Stable CAD New York Heart Association class I we will likely intensify her lipid-lowering therapy and for now avoid aspirin with intolerance and previous GI bleed 3. Continue Zetia check lipid profile CMP additional agent as needed PCSK9   Next appointment: 1 month   Medication Adjustments/Labs and Tests Ordered: Current medicines are reviewed at length with the patient today.  Concerns regarding medicines are outlined above.  No orders of the defined types were placed in this encounter.  No orders of the defined types were placed in this encounter.   Chief Complaint  Patient presents with  . Headache  . Follow-up  . Coronary Artery Disease    History of Present Illness:    Amanda Pham is a 77 y.o. female with a hx of hypertension hyperlipidemia GERD and rheumatoid arthritis who underwent coronary angiography 05/15/2017 showing the presence of moderate CAD 40 to 60% tubular LAD stenosis 20 to 30% marginal stenosis and 50% ostial PDA treated medically.  She was last seen Dr Wynonia Lawman 05/05/17.  Compliance with diet, lifestyle and medications: Yes  This visit is prompted because she had episodes of palpitation headache and had systolic blood pressure systolic of [id number] at home.  She has  background history of hypertension but never episodes like this today her blood pressure is at the low risk limits of normal both arms she is at risk for and we will check a renal vascular duplex and give her prescription for clonidine she can take for paroxysmal hypertension I asked her to continue to survey her blood pressure at home .  She is taking lipid-lowering therapy with Zetia 10 mg daily unlikely to be at target we will check liver function lipid profile and likely reinstitute PCSK9.  Unfortunately she cannot take aspirin with esophageal symptoms and previous GI bleed.  She has had no angina but has not felt well she has weakness her daughter to see some memory changes she is depressed with the death of her husband and son but no edema orthopnea angina palpitation or syncope Coronary Diagrams   Diagnostic  Dominance: Right    Intervention   Past Medical History:  Diagnosis Date  . Arthritis   . Fibromyalgia   . Hyperlipidemia   . Hypertension   . IBS (irritable bowel syndrome)   . Knee osteonecrosis ([place])   . Osteopenia     Past Surgical History:  Procedure Laterality Date  . BILATERAL SALPINGOOPHORECTOMY Bilateral 1979   endometriosis  . CARPAL TUNNEL RELEASE Right 1989  . COLONOSCOPY W/ BIOPSIES  06/12/09   sigmoid diverticulitis  . ESOPHAGOGASTRODUODENOSCOPY ENDOSCOPY  06/12/09   gastric polyps X 25  . ESOPHAGOGASTRODUODENOSCOPY ENDOSCOPY  08/14/09   benign fundal polyps - no adenoma  .  LEFT HEART CATH AND CORONARY ANGIOGRAPHY N/A 05/15/2017   Procedure: LEFT HEART CATH AND CORONARY ANGIOGRAPHY;  Surgeon: Belva Crome, MD;  Location: [facility];  Service: Cardiovascular;  Laterality: N/A;  . 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 TOTAL ABDOMINAL HYSTERECTOMY  1978    Current Medications: Current Meds  Medication Sig  . cycloSPORINE (RESTASIS) 0.05 % ophthalmic emulsion Place 1 drop into both eyes 2 (two) times daily.    . diclofenac sodium (VOLTAREN) 1 % GEL Apply 2 g topically 3 (three) times daily.  Marland Kitchen escitalopram (LEXAPRO) 10 MG tablet TAKE 1 TABLET (10 MG TOTAL) BY MOUTH DAILY.  . hyoscyamine (ANASPAZ) 0.125 MG TBDP Place 0.125 mg under the tongue every 4 (four) hours as needed.   Marland Kitchen levocetirizine (XYZAL) 5 MG tablet TAKE 1 TABLET (5 MG TOTAL) BY MOUTH DAILY AS NEEDED.  Marland Kitchen lisinopril-hydrochlorothiazide (PRINZIDE,ZESTORETIC) 20-25 MG per tablet TAKE 1/2 TABLET TWICE A DAY  . NONFORMULARY OR COMPOUNDED ITEM Estradiol versabase (14ml) 0.1% (1mg /1ML) cream.  Insert one syringe vaginally qhs three weekly.  Disp:  60ml (two month supply)  . omeprazole (PRILOSEC) 40 MG capsule TAKE 1 CAPSULE (40 MG TOTAL) BY MOUTH DAILY.  Marland Kitchen predniSONE (DELTASONE) 20 MG tablet Take 60 mg by mouth daily.  . Tocilizumab (ACTEMRA IV) Inject into the vein. Every 4-6 weeks  . valACYclovir (VALTREX) 500 MG tablet TAKE 1 TABLET THREE TIMES A DAY AS NEEDED  . Vitamin D, Ergocalciferol, (DRISDOL) 50000 UNITS CAPS capsule Take 1 capsule (50,000 Units total) by mouth every 7 (seven) days.     Allergies:   Ivp dye [iodinated diagnostic agents]; Propylene glycol; Penicillins; Plaquenil [hydroxychloroquine sulfate]; Sulfonamide derivatives; and Adhesive [tape]   Social History   Socioeconomic History  . Marital status: Married    Spouse name: Not on file  . Number of children: 3  . Years of education: Not on file  . Highest education level: Not on file  Occupational History    Employer: RETIRED  Social Needs  . Financial resource strain: Not on file  . Food insecurity:    Worry: Not on file    Inability: Not on file  . Transportation needs:    Medical: Not on file    Non-medical: Not on file  Tobacco Use  . Smoking status: Never Smoker  Substance and Sexual Activity  . Alcohol use: No  . Drug use: No  . Sexual activity: Not on file  Lifestyle  . Physical activity:    Days per week: Not on file    Minutes per session: Not  on file  . Stress: Not on file  Relationships  . Social connections:    Talks on phone: Not on file    Gets together: Not on file    Attends religious service: Not on file    Active member of club or organization: Not on file    Attends meetings of clubs or organizations: Not on file    Relationship status: Not on file  Other Topics Concern  . Not on file  Social History Narrative  . Not on file     Family History: The patient's family history includes Breast cancer (age of onset: 81) in her sister. ROS:   Please see the history of present illness.    All other systems reviewed and are negative.  EKGs/Labs/Other Studies Reviewed:    The following studies were reviewed today:  EKG:  EKG ordered today.  The ekg ordered today demonstrates [place] normal  Recent Labs: 05/04/2017: BUN 16; Creatinine, Ser 1.03; Hemoglobin 13.5; Platelets 268; Potassium 4.4; Sodium 138  Recent Lipid Panel   01/22/17: Chol 199 HDL 55 LDL 139    Component Value Date/Time   CHOL 269 (H) 04/26/2010 1041   TRIG 235.0 (H) 04/26/2010 1041   HDL 56.80 04/26/2010 1041   CHOLHDL 5 04/26/2010 1041   VLDL 47.0 (H) 04/26/2010 1041   LDLCALC 92 07/21/2007 1101   LDLDIRECT 171.5 04/26/2010 1041   Lipid profile 01/22/2017 cholesterol 199 HDL 55 LDL 139 on Zetia Physical Exam:    VS:  BP 110/70 (BP Location: Right Arm, Patient Position: Sitting, Cuff Size: Normal)   Wt 174 lb (78.9 kg)   LMP 02/11/1976   BMI 28.96 kg/m     Wt Readings from Last 3 Encounters:  02/08/18 174 lb (78.9 kg)  05/15/17 175 lb (79.4 kg)  05/04/17 182 lb (82.6 kg)     GEN:  Well nourished, well developed in no acute distress HEENT: Normal NECK: No JVD; No carotid bruits LYMPHATICS: No lymphadenopathy CARDIAC: RRR, no murmurs, rubs, gallops RESPIRATORY:  Clear to auscultation without rales, wheezing or rhonchi  ABDOMEN: Soft, non-tender, non-distended MUSCULOSKELETAL:  No edema; No deformity  SKIN: Warm and dry NEUROLOGIC:   Alert and oriented x 3 PSYCHIATRIC:  Normal affect    Signed, Shirlee More, MD  02/08/2018 4:12 PM    [facility]

## 2018-02-08 ENCOUNTER — Ambulatory Visit (INDEPENDENT_AMBULATORY_CARE_PROVIDER_SITE_OTHER): Payer: Medicare Other | Admitting: Cardiology

## 2018-02-08 VITALS — BP 110/70 | HR 59 | Ht 65.0 in | Wt 174.0 lb

## 2018-02-08 DIAGNOSIS — I10 Essential (primary) hypertension: Secondary | ICD-10-CM | POA: Diagnosis not present

## 2018-02-08 DIAGNOSIS — E785 Hyperlipidemia, unspecified: Secondary | ICD-10-CM | POA: Diagnosis not present

## 2018-02-08 DIAGNOSIS — I25119 Atherosclerotic heart disease of native coronary artery with unspecified angina pectoris: Secondary | ICD-10-CM | POA: Diagnosis not present

## 2018-02-08 MED ORDER — CLONIDINE HCL 0.1 MG PO TABS: 0.1000 mg | ORAL_TABLET | Freq: Every day | ORAL | 3 refills | Status: DC | PRN

## 2018-02-08 NOTE — Patient Instructions (Signed)
Medication Instructions:  Your physician has recommended you make the following change in your medication:   START clonidine (catapres) 0.1 mg: Take 1 tablet daily as needed if your systolic blood pressure (top number) is greater than 180   If you need a refill on your cardiac medications before your next appointment, please call your pharmacy.   Lab work: Your physician recommends that you return for lab work today: CMP, lipid panel.   If you have labs (blood work) drawn today and your tests are completely normal, you will receive your results only by: Marland Kitchen MyChart Message (if you have MyChart) OR . A paper copy in the mail If you have any lab test that is abnormal or we need to change your treatment, we will call you to review the results.  Testing/Procedures: You had an EKG today.   Your physician has requested that you have a renal artery duplex. During this test, an ultrasound is used to evaluate blood flow to the kidneys. Allow one hour for this exam. Do not eat after midnight the day before and avoid carbonated beverages. Take your medications as you usually do.  Follow-Up: At [facility], you and your health needs are our priority.  As part of our continuing mission to provide you with exceptional heart care, we have created designated Provider Care Teams.  These Care Teams include your primary Cardiologist (physician) and Advanced Practice Providers (APPs -  Physician Assistants and Nurse Practitioners) who all work together to provide you with the care you need, when you need it. You will need a follow up appointment in 1 months.      Clonidine tablets What is this medicine? CLONIDINE (KLOE ni deen) is used to treat high blood pressure. This medicine may be used for other purposes; ask your health care provider or pharmacist if you have questions. COMMON BRAND NAME(S): Catapres What should I tell my health care provider before I take this medicine? They need to know if you  have any of these conditions: -kidney disease -an unusual or allergic reaction to clonidine, other medicines, foods, dyes, or preservatives -pregnant or trying to get pregnant -breast-feeding How should I use this medicine? Take this medicine by mouth with a glass of water. Follow the directions on the prescription label. Take your doses at regular intervals. Do not take your medicine more often than directed. Do not suddenly stop taking this medicine. You must gradually reduce the dose or you may get a dangerous increase in blood pressure. Ask your doctor or health care professional for advice. Talk to your pediatrician regarding the use of this medicine in children. Special care may be needed. Overdosage: If you think you have taken too much of this medicine contact a poison control center or emergency room at once. NOTE: This medicine is only for you. Do not share this medicine with others. What if I miss a dose? If you miss a dose, take it as soon as you can. If it is almost time for your next dose, take only that dose. Do not take double or extra doses. What may interact with this medicine? Do not take this medicine with any of the following medications: -MAOIs like Carbex, Eldepryl, Marplan, Nardil, and Parnate This medicine may also interact with the following medications: -barbiturate medicines for inducing sleep or treating seizures like phenobarbital -certain medicines for blood pressure, heart disease, irregular heart beat -certain medicines for depression, anxiety, or psychotic disturbances -prescription pain medicines This list may not describe  all possible interactions. Give your health care provider a list of all the medicines, herbs, non-prescription drugs, or dietary supplements you use. Also tell them if you smoke, drink alcohol, or use illegal drugs. Some items may interact with your medicine. What should I watch for while using this medicine? Visit your doctor or health care  professional for regular checks on your progress. Check your heart rate and blood pressure regularly while you are taking this medicine. Ask your doctor or health care professional what your heart rate should be and when you should contact him or her. You may get drowsy or dizzy. Do not drive, use machinery, or do anything that needs mental alertness until you know how this medicine affects you. To avoid dizzy or fainting spells, do not stand or sit up quickly, especially if you are an older person. Alcohol can make you more drowsy and dizzy. Avoid alcoholic drinks. Your mouth may get dry. Chewing sugarless gum or sucking hard candy, and drinking plenty of water will help. Do not treat yourself for coughs, colds, or pain while you are taking this medicine without asking your doctor or health care professional for advice. Some ingredients may increase your blood pressure. If you are going to have surgery tell your doctor or health care professional that you are taking this medicine. What side effects may I notice from receiving this medicine? Side effects that you should report to your doctor or health care professional as soon as possible: -allergic reactions like skin rash, itching or hives, swelling of the face, lips, or tongue -anxiety, nervousness -chest pain -depression -fast, irregular heartbeat -swelling of feet or legs -unusually weak or tired Side effects that usually do not require medical attention (report to your doctor or health care professional if they continue or are bothersome): -change in sex drive or performance -constipation -headache This list may not describe all possible side effects. Call your doctor for medical advice about side effects. You may report side effects to FDA at 1-800-FDA-1088. Where should I keep my medicine? Keep out of the reach of children. Store at room temperature between 15 and 30 degrees C (59 and 86 degrees F). Protect from light. Keep container  tightly closed. Throw away any unused medicine after the expiration date. NOTE: This sheet is a summary. It may not cover all possible information. If you have questions about this medicine, talk to your doctor, pharmacist, or health care provider.  2019 Elsevier/Gold Standard (2010-07-24 13:01:28)

## 2018-02-09 LAB — LIPID PANEL
CHOLESTEROL TOTAL: 221 mg/dL — AB (ref 100–199)
Chol/HDL Ratio: 3.5 ratio (ref 0.0–4.4)
HDL: 64 mg/dL (ref >39)
LDL Calculated: 113 mg/dL — ABNORMAL HIGH (ref 0–99)
TRIGLYCERIDES: 218 mg/dL — AB (ref 0–149)
VLDL Cholesterol Cal: 44 mg/dL — ABNORMAL HIGH (ref 5–40)

## 2018-02-09 LAB — COMPREHENSIVE METABOLIC PANEL
A/G RATIO: 2 (ref 1.2–2.2)
ALBUMIN: 4.5 g/dL (ref 3.5–4.8)
ALT: 17 IU/L (ref 0–32)
AST: 21 IU/L (ref 0–40)
Alkaline Phosphatase: 42 IU/L (ref 39–117)
BUN / CREAT RATIO: 13 (ref 12–28)
BUN: 17 mg/dL (ref 8–27)
Bilirubin Total: 0.4 mg/dL (ref 0.0–1.2)
CALCIUM: 9.6 mg/dL (ref 8.7–10.3)
CO2: 27 mmol/L (ref 20–29)
CREATININE: 1.28 mg/dL — AB (ref 0.57–1.00)
Chloride: 99 mmol/L (ref 96–106)
GFR, EST AFRICAN AMERICAN: 47 mL/min/{1.73_m2} — AB (ref >59)
GFR, EST NON AFRICAN AMERICAN: 40 mL/min/{1.73_m2} — AB (ref >59)
GLOBULIN, TOTAL: 2.3 g/dL (ref 1.5–4.5)
Glucose: 93 mg/dL (ref 65–99)
Potassium: 4.3 mmol/L (ref 3.5–5.2)
SODIUM: 141 mmol/L (ref 134–144)
Total Protein: 6.8 g/dL (ref 6.0–8.5)

## 2018-02-11 ENCOUNTER — Telehealth: Payer: Self-pay | Admitting: Cardiology

## 2018-02-11 NOTE — Telephone Encounter (Signed)
Patient has some questions regarding her labs and also the infusion she is suppose to have soon.  Please call patient to discuss

## 2018-02-11 NOTE — Telephone Encounter (Signed)
Left message to return call 

## 2018-02-12 NOTE — Telephone Encounter (Signed)
Spoke with patient's sister. Patient is not home right now she will have her call us back.

## 2018-02-15 ENCOUNTER — Telehealth: Payer: Self-pay | Admitting: Cardiology

## 2018-02-15 DIAGNOSIS — I25119 Atherosclerotic heart disease of native coronary artery with unspecified angina pectoris: Secondary | ICD-10-CM

## 2018-02-15 DIAGNOSIS — E785 Hyperlipidemia, unspecified: Secondary | ICD-10-CM

## 2018-02-15 NOTE — Telephone Encounter (Signed)
Patient informed she has been referred to the lipid clinic and they will call with an appointment within a week. If not I have advised her to please call our office. Dr. Bettina Gavia approved for her to have her infusion for RA. Informed patient of all of this and she verbally understands. Advised her to call back with any questions.

## 2018-02-15 NOTE — Telephone Encounter (Signed)
Please see previous telephone encounter.

## 2018-02-15 NOTE — Addendum Note (Signed)
Addended by: Ashok Norris on: 02/15/2018 03:54 PM   Modules accepted: Orders

## 2018-02-15 NOTE — Telephone Encounter (Signed)
Left message for patient to return call.

## 2018-02-15 NOTE — Telephone Encounter (Signed)
Yes

## 2018-02-15 NOTE — Telephone Encounter (Signed)
Start repatha, refer to lipid clinic if needed for preapproval

## 2018-02-15 NOTE — Telephone Encounter (Signed)
Patient came in to [facility] to speak to you. Patient states she is having an infusion for RA tomorrow morning and wants to make sure it is safe for her to do. Please advise on her cell phone.

## 2018-02-15 NOTE — Telephone Encounter (Signed)
Patient is calling back regarding recent lab results. Informed patient of results and that Dr. Bettina Gavia has advised her to start repatha. Will consult with Dr . Bettina Gavia to determine if she needs to be seen by the lipid clinic to initiate this therapy or not. She also needs approval for from Dr. Bettina Gavia for her to have her actemra rheumatoid arthritis medication since it caused issues with her blood pressure last time. She reports recently she hasn't had any issues with blood pressure and hasn't had to take the clonidine that was prescribed as needed.

## 2018-02-16 ENCOUNTER — Ambulatory Visit (HOSPITAL_BASED_OUTPATIENT_CLINIC_OR_DEPARTMENT_OTHER)
Admission: RE | Admit: 2018-02-16 | Discharge: 2018-02-16 | Disposition: A | Discharge status: Home / Self Care | Payer: Medicare Other | Source: Ambulatory Visit | Attending: Internal Medicine | Admitting: Internal Medicine

## 2018-02-16 DIAGNOSIS — I10 Essential (primary) hypertension: Secondary | ICD-10-CM | POA: Diagnosis not present

## 2018-02-18 ENCOUNTER — Ambulatory Visit (INDEPENDENT_AMBULATORY_CARE_PROVIDER_SITE_OTHER): Payer: Medicare Other | Admitting: Pharmacist

## 2018-02-18 DIAGNOSIS — E785 Hyperlipidemia, unspecified: Secondary | ICD-10-CM | POA: Diagnosis not present

## 2018-02-18 NOTE — Patient Instructions (Signed)
Lipid Clinic (pharmacist) Marylen Zuk/Krist[place] [phone]  *Call clinic in 4-5 days to report any reaction to Praluent sample* *START paperwork for Praluent 75mg  every 14 days*   Cholesterol  Cholesterol is a fat. Your body needs a small amount of cholesterol. Cholesterol (plaque) may build up in your blood vessels (arteries). That makes you more likely to have a heart attack or stroke. You cannot feel your cholesterol level. Having a blood test is the only way to find out if your level is high. Keep your test results. Work with your doctor to keep your cholesterol at a good level. What do the results mean?  Total cholesterol is how much cholesterol is in your blood.  LDL is bad cholesterol. This is the type that can build up. Try to have low LDL.  HDL is good cholesterol. It cleans your blood vessels and carries LDL away. Try to have high HDL.  Triglycerides are fat that the body can store or burn for energy. What are good levels of cholesterol?  Total cholesterol below 200.  LDL below 100 is good for people who have health risks. LDL below 70 is good for people who have very high risks.  HDL above 40 is good. It is best to have HDL of 60 or higher.  Triglycerides below 150. How can I lower my cholesterol? Diet Follow your diet program as told by your doctor.  Choose fish, white meat chicken, or [place] that is roasted or baked. Try not to eat red meat, fried foods, sausage, or lunch meats.  Eat lots of fresh fruits and vegetables.  Choose whole grains, beans, pasta, potatoes, and cereals.  Choose olive oil, corn oil, or canola oil. Only use small amounts.  Try not to eat butter, mayonnaise, shortening, or palm kernel oils.  Try not to eat foods with trans fats.  Choose low-fat or nonfat dairy foods. ? Drink skim or nonfat milk. ? Eat low-fat or nonfat yogurt and cheeses. ? Try not to drink whole milk or cream. ? Try not to eat ice cream, egg yolks, or full-fat  cheeses.  Healthy desserts include angel food cake, ginger snaps, animal crackers, hard candy, popsicles, and low-fat or nonfat frozen yogurt. Try not to eat pastries, cakes, pies, and cookies.  Exercise Follow your exercise program as told by your doctor.  Be more active. Try gardening, walking, and taking the stairs.  Ask your doctor about ways that you can be more active. Medicine  Take over-the-counter and prescription medicines only as told by your doctor. This information is not intended to replace advice given to you by your health care provider. Make sure you discuss any questions you have with your health care provider. Document Released: 04/25/2008 Document Revised: 08/29/2015 Document Reviewed: 08/09/2015 Elsevier Interactive Patient Education  2019 [employer].

## 2018-02-18 NOTE — Progress Notes (Signed)
Patient ID: Amanda Pham                 DOB: [birth year]                    MRN: [id number]     HPI: Amanda Pham is a 78 y.o. female patient referred to lipid clinic by Dr Bettina Gavia. PMH is significant for hypertension, hyperlipidemia, GERD,  rheumatoid arthritis, and ASCVD with 40 to 60% tubular LAD stenosis, 20 to 30% marginal stenosis ,and 50% ostial PDA stenosis. Presnets to clinic for potential PCSK9i initiation and counseling. Reports ADR and allergies to multiple medication but willing to try recommendations at this time.   Current Medications:  Ezetimibe 10mg  daily  Intolerances:  lipitor  - severe legs spasms and pain; unable to ambulate (memory problems) crestor - severe leg spasms  LDL goal: 70mg /dL  Diet: mainly home cooked meal, low fat  Family History: high cholesterol with mother and father,  Breast cancer (age of onset: 14) in her sister  Social History: denies tobacco and alcohol use  Labs: 02/08/2018: CHO 221; TG 218; HDL 64; LDL-c 113 (on ezetimibe 10mg  daily)  Past Medical History:  Diagnosis Date  . Arthritis   . Fibromyalgia   . Hyperlipidemia   . Hypertension   . IBS (irritable bowel syndrome)   . Knee osteonecrosis ([place])   . Osteopenia     Current Outpatient Medications on File Prior to Visit  Medication Sig Dispense Refill  . cloNIDine (CATAPRES) 0.1 MG tablet Take 1 tablet (0.1 mg total) by mouth daily as needed. Take if the top number of your BP > 180. 30 tablet 3  . cycloSPORINE (RESTASIS) 0.05 % ophthalmic emulsion Place 1 drop into both eyes 2 (two) times daily.    . diclofenac sodium (VOLTAREN) 1 % GEL Apply 2 g topically 3 (three) times daily. 100 g 3  . escitalopram (LEXAPRO) 10 MG tablet TAKE 1 TABLET (10 MG TOTAL) BY MOUTH DAILY. 30 tablet 10  . ezetimibe (ZETIA) 10 MG tablet Take 10 mg by mouth daily.    . hyoscyamine (ANASPAZ) 0.125 MG TBDP Place 0.125 mg under the tongue every 4 (four) hours as needed.     Marland Kitchen levocetirizine (XYZAL) 5  MG tablet TAKE 1 TABLET (5 MG TOTAL) BY MOUTH DAILY AS NEEDED. 30 tablet 0  . lisinopril-hydrochlorothiazide (PRINZIDE,ZESTORETIC) 20-25 MG per tablet TAKE 1/2 TABLET TWICE A DAY 90 tablet 1  . NONFORMULARY OR COMPOUNDED ITEM Estradiol versabase (2ml) 0.1% (1mg /1ML) cream.  Insert one syringe vaginally qhs three weekly.  Disp:  71ml (two month supply) 4 each 0  . omeprazole (PRILOSEC) 40 MG capsule TAKE 1 CAPSULE (40 MG TOTAL) BY MOUTH DAILY. 30 capsule 5  . Tocilizumab (ACTEMRA IV) Inject into the vein. Every 4-6 weeks    . valACYclovir (VALTREX) 500 MG tablet TAKE 1 TABLET THREE TIMES A DAY AS NEEDED 30 tablet 2  . Vitamin D, Ergocalciferol, (DRISDOL) 50000 UNITS CAPS capsule Take 1 capsule (50,000 Units total) by mouth every 7 (seven) days. 12 capsule 0   No current facility-administered medications on file prior to visit.     Allergies  Allergen Reactions  . Ivp Dye [Iodinated Diagnostic Agents] Anaphylaxis    "I bottomed out"  . Propylene Glycol Other (See Comments)    Skin irritation topically. Derivatives in oral endo prep cause extreme projectile vomiting.  Marland Kitchen Penicillins     "unknown"  . Plaquenil [Hydroxychloroquine Sulfate] Nausea And Vomiting  .  Sulfonamide Derivatives Nausea And Vomiting  . Adhesive [Tape] Rash  . Praluent [Alirocumab] Itching and Rash    Pt called stated they had a reaction to praluent 75 size of 2 50cent pieces light pink itches. Pt stated that they saw a rheumatologist and they are afraid that if she continues she will have a worse reaction    Hyperlipidemia LDL goal <70 LDL remains above goal for secondary prevention. Noted patient unable to tolerate statins and currently compliant with ezetimibe regimen. She continues to follow low fat diet and remains physically active.   Repatha/Prlauent indication, storage, administration, common side effects , monitoring , prior-authorization process, and patient assistance programs were discussed during this office  visit.  Due to her severe reaction to some monoclonal medication in the past, I will provide a sample of Praluent 75mg ( lot [id number], exp 01-10-2019) for selft-administration in office. Will keep patient in clinic for 20 minutes observation for potential sign of allergic reaction. Then, will allow 72 hours for home assessment. Patient instructed to call back  After 3 days to give [place] the okay to initiate PA for Praleunt 75mg  auto-injector.   Sherell Christoffel Rodriguez-Guzman PharmD, BCPS, [place] [address] [place] [address] 02/24/2018 5:59 PM

## 2018-02-24 ENCOUNTER — Encounter: Payer: Self-pay | Admitting: Pharmacist

## 2018-02-24 ENCOUNTER — Telehealth: Payer: Self-pay

## 2018-02-24 NOTE — Telephone Encounter (Signed)
Pt called stated they had a reaction to praluent 75 size of 2 50cent pieces light pink itches. Pt stated that they saw a rheumatologist and they are afraid that if she continues she will have a worse reaction

## 2018-02-24 NOTE — Assessment & Plan Note (Signed)
LDL remains above goal for secondary prevention. Noted patient unable to tolerate statins and currently compliant with ezetimibe regimen. She continues to follow low fat diet and remains physically active.   Repatha/Prlauent indication, storage, administration, common side effects , monitoring , prior-authorization process, and patient assistance programs were discussed during this office visit.  Due to her severe reaction to some monoclonal medication in the past, I will provide a sample of Praluent 75mg ( lot [id number], exp 01-10-2019) for selft-administration in office. Will keep patient in clinic for 20 minutes observation for potential sign of allergic reaction. Then, will allow 72 hours for home assessment. Patient instructed to call back  After 3 days to give [place] the okay to initiate PA for Praleunt 75mg  auto-injector.

## 2018-02-24 NOTE — Telephone Encounter (Signed)
Patient is allergic to Praluentt. Will try 1 sample of Repatha in office on the week of January 27th.    If patient able to tolerate, I will initiate paperwork for Repatha PA

## 2018-03-21 NOTE — Progress Notes (Signed)
Cardiology Office Note:    Date:  03/22/2018   ID:  Amanda Pham, DOB 1940/04/16, MRN [id number]  PCP:  Drake Leach, MD  Cardiologist:  Shirlee More, MD    Referring MD: Drake Leach, MD    ASSESSMENT:    1. Coronary artery disease involving native coronary artery of native heart with angina pectoris ([place])   2. Essential hypertension   3. Hyperlipidemia LDL goal <70    PLAN:    In order of problems listed above:  1. Stable to restart low-dose aspirin intensify lipid-lowering therapy adding a different PCSK9 continue Zetia and follow-up in 6 months. 2. Stable blood pressure target continue current treatment renal vascular duplex is normal 3. Continue Zetia refer to lipid clinic for consideration of a different PCSK9 with a local reaction   Next appointment: 6 months   Medication Adjustments/Labs and Tests Ordered: Current medicines are reviewed at length with the patient today.  Concerns regarding medicines are outlined above.  No orders of the defined types were placed in this encounter.  No orders of the defined types were placed in this encounter.   Chief Complaint  Patient presents with  . Follow-up  . Coronary Artery Disease  . Hyperlipidemia    History of Present Illness:    Amanda Pham is a 78 y.o. female with a hx of hypertension hyperlipidemia GERD and rheumatoid arthritis who underwent coronary angiography 05/15/2017 showing the presence of moderate CAD 40 to 60% tubular LAD stenosis 20 to 30% marginal stenosis and 50% ostial PDA treated medically.    She was last seen 02/08/18 with poorly controlled HTN.  Renal duplex was normal:Summary:-02/16/18 Largest Aortic Diameter: 1.8 cm Right: Normal size right kidney. No evidence of right renal artery        stenosis. Left:  Normal size of left kidney. No evidence of left renal artery        stenosis. Mesenteric:Normal [place] Mesenteric artery findings.  Compliance with diet, lifestyle and  medications: yes  She is intolerant of Praluent with a severe local reaction refer back to lipid clinic for consideration of Repatha.  She is not needed nitroglycerin has had no true angina but at times has a vague momentary discomfort in the chest not severe or limiting or frequent.  Renal vascular duplex showed no evidence of renal artery stenosis Past Medical History:  Diagnosis Date  . Arthritis   . Fibromyalgia   . Hyperlipidemia   . Hypertension   . IBS (irritable bowel syndrome)   . Knee osteonecrosis ([place])   . Osteopenia     Past Surgical History:  Procedure Laterality Date  . BILATERAL SALPINGOOPHORECTOMY Bilateral 1979   endometriosis  . CARPAL TUNNEL RELEASE Right 1989  . COLONOSCOPY W/ BIOPSIES  06/12/09   sigmoid diverticulitis  . ESOPHAGOGASTRODUODENOSCOPY ENDOSCOPY  06/12/09   gastric polyps X 25  . ESOPHAGOGASTRODUODENOSCOPY ENDOSCOPY  08/14/09   benign fundal polyps - no adenoma  . LEFT HEART CATH AND CORONARY ANGIOGRAPHY N/A 05/15/2017   Procedure: LEFT HEART CATH AND CORONARY ANGIOGRAPHY;  Surgeon: Belva Crome, MD;  Location: [facility];  Service: Cardiovascular;  Laterality: N/A;  . 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 TOTAL ABDOMINAL HYSTERECTOMY  1978    Current Medications: Current Meds  Medication Sig  . citalopram (CELEXA) 20 MG tablet Take 0.5 tablets by mouth 2 (two) times daily.  . cloNIDine (CATAPRES) 0.1 MG tablet Take 1  tablet (0.1 mg total) by mouth daily as needed. Take if the top number of your BP > 180.  . cycloSPORINE (RESTASIS) 0.05 % ophthalmic emulsion Place 1 drop into both eyes 2 (two) times daily.  . diclofenac sodium (VOLTAREN) 1 % GEL Apply 2 g topically 3 (three) times daily.  Marland Kitchen escitalopram (LEXAPRO) 10 MG tablet TAKE 1 TABLET (10 MG TOTAL) BY MOUTH DAILY.  Marland Kitchen ezetimibe (ZETIA) 10 MG tablet Take 10 mg by mouth daily.  . hyoscyamine (ANASPAZ) 0.125 MG TBDP Place 0.125 mg under the  tongue every 4 (four) hours as needed.   Marland Kitchen levocetirizine (XYZAL) 5 MG tablet TAKE 1 TABLET (5 MG TOTAL) BY MOUTH DAILY AS NEEDED.  Marland Kitchen lisinopril-hydrochlorothiazide (PRINZIDE,ZESTORETIC) 20-25 MG per tablet TAKE 1/2 TABLET TWICE A DAY  . nitroGLYCERIN (NITROSTAT) 0.6 MG SL tablet Place 0.6 tablets under the tongue as needed.  . NONFORMULARY OR COMPOUNDED ITEM Estradiol versabase (81ml) 0.1% (1mg /1ML) cream.  Insert one syringe vaginally qhs three weekly.  Disp:  9ml (two month supply)  . omeprazole (PRILOSEC) 40 MG capsule TAKE 1 CAPSULE (40 MG TOTAL) BY MOUTH DAILY.  Marland Kitchen Tocilizumab (ACTEMRA IV) Inject into the vein. Every 4-6 weeks  . valACYclovir (VALTREX) 500 MG tablet TAKE 1 TABLET THREE TIMES A DAY AS NEEDED  . Vitamin D, Ergocalciferol, (DRISDOL) 50000 UNITS CAPS capsule Take 1 capsule (50,000 Units total) by mouth every 7 (seven) days.     Allergies:   Ivp dye [iodinated diagnostic agents]; Propylene glycol; Penicillins; Plaquenil [hydroxychloroquine sulfate]; Sulfonamide derivatives; Adhesive [tape]; and Praluent [alirocumab]   Social History   Socioeconomic History  . Marital status: Married    Spouse name: Not on file  . Number of children: 3  . Years of education: Not on file  . Highest education level: Not on file  Occupational History    Employer: RETIRED  Social Needs  . Financial resource strain: Not on file  . Food insecurity:    Worry: Not on file    Inability: Not on file  . Transportation needs:    Medical: Not on file    Non-medical: Not on file  Tobacco Use  . Smoking status: Never Smoker  . Smokeless tobacco: Never Used  Substance and Sexual Activity  . Alcohol use: No  . Drug use: No  . Sexual activity: Not on file  Lifestyle  . Physical activity:    Days per week: Not on file    Minutes per session: Not on file  . Stress: Not on file  Relationships  . Social connections:    Talks on phone: Not on file    Gets together: Not on file    Attends  religious service: Not on file    Active member of club or organization: Not on file    Attends meetings of clubs or organizations: Not on file    Relationship status: Not on file  Other Topics Concern  . Not on file  Social History Narrative  . Not on file     Family History: The patient's family history includes Breast cancer (age of onset: 80) in her sister. ROS:   Please see the history of present illness.    All other systems reviewed and are negative.  EKGs/Labs/Other Studies Reviewed:    The following studies were reviewed today:   Recent Labs: 05/04/2017: Hemoglobin 13.5; Platelets 268 02/08/2018: ALT 17; BUN 17; Creatinine, Ser 1.28; Potassium 4.3; Sodium 141  Recent Lipid Panel    Component Value  Date/Time   CHOL 221 (H) 02/08/2018 1706   TRIG 218 (H) 02/08/2018 1706   HDL 64 02/08/2018 1706   CHOLHDL 3.5 02/08/2018 1706   CHOLHDL 5 04/26/2010 1041   VLDL 47.0 (H) 04/26/2010 1041   LDLCALC 113 (H) 02/08/2018 1706   LDLDIRECT 171.5 04/26/2010 1041    Physical Exam:    VS:  BP 112/66 (BP Location: Right Arm, Patient Position: Sitting, Cuff Size: Normal)   Pulse 66   Ht 5\' 5"  (1.651 m)   Wt 180 lb 12.8 oz (82 kg)   LMP 02/11/1976   SpO2 98%   BMI 30.09 kg/m     Wt Readings from Last 3 Encounters:  03/22/18 180 lb 12.8 oz (82 kg)  02/08/18 174 lb (78.9 kg)  05/15/17 175 lb (79.4 kg)     GEN:  Well nourished, well developed in no acute distress HEENT: Normal NECK: No JVD; No carotid bruits LYMPHATICS: No lymphadenopathy CARDIAC: RRR, no murmurs, rubs, gallops RESPIRATORY:  Clear to auscultation without rales, wheezing or rhonchi  ABDOMEN: Soft, non-tender, non-distended MUSCULOSKELETAL:  No edema; No deformity  SKIN: Warm and dry NEUROLOGIC:  Alert and oriented x 3 PSYCHIATRIC:  Normal affect    Signed, Shirlee More, MD  03/22/2018 11:08 AM    [place]

## 2018-03-22 ENCOUNTER — Telehealth: Payer: Self-pay

## 2018-03-22 ENCOUNTER — Encounter: Payer: Self-pay | Admitting: Cardiology

## 2018-03-22 ENCOUNTER — Ambulatory Visit (INDEPENDENT_AMBULATORY_CARE_PROVIDER_SITE_OTHER): Payer: Medicare Other | Admitting: Cardiology

## 2018-03-22 VITALS — BP 112/66 | HR 66 | Ht 65.0 in | Wt 180.8 lb

## 2018-03-22 DIAGNOSIS — E785 Hyperlipidemia, unspecified: Secondary | ICD-10-CM | POA: Diagnosis not present

## 2018-03-22 DIAGNOSIS — I25119 Atherosclerotic heart disease of native coronary artery with unspecified angina pectoris: Secondary | ICD-10-CM | POA: Diagnosis not present

## 2018-03-22 DIAGNOSIS — I10 Essential (primary) hypertension: Secondary | ICD-10-CM

## 2018-03-22 MED ORDER — EZETIMIBE 10 MG PO TABS: 10.0000 mg | ORAL_TABLET | Freq: Every day | ORAL | 1 refills | Status: DC

## 2018-03-22 MED ORDER — ASPIRIN EC 81 MG PO TBEC: 81.0000 mg | DELAYED_RELEASE_TABLET | Freq: Every day | ORAL | 3 refills | Status: DC

## 2018-03-22 NOTE — Patient Instructions (Addendum)
Medication Instructions:  Your physician has recommended you make the following change in your medication: START  CO ASA 81mg  aspirin Monday, Wed, Friday with food.    If you need a refill on your cardiac medications before your next appointment, please call your pharmacy.   Lab work: NONE If you have labs (blood work) drawn today and your tests are completely normal, you will receive your results only by:  [place] (if you have MyChart) OR  A paper copy in the mail If you have any lab test that is abnormal or we need to change your treatment, we will call you to review the results.  Testing/Procedures: NONE  Follow-Up: At [facility], you and your health needs are our priority.  As part of our continuing mission to provide you with exceptional heart care, we have created designated Provider Care Teams.  These Care Teams include your primary Cardiologist (physician) and Advanced Practice Providers (APPs -  Physician Assistants and Nurse Practitioners) who all work together to provide you with the care you need, when you need it. You will need a follow up appointment in 6 months.  Please call our office 2 months in advance to schedule this appointment.  You may see No primary care provider on file.Provider Team in [facility]: Dr. Bettina Gavia    Aspirin and Your Heart  Aspirin is a medicine that prevents the cells in the blood that are used for clotting, called platelets, from sticking together. Aspirin can be used to help reduce the risk of blood clots, heart attacks, and other heart-related problems. Can I take aspirin? Your health care provider will help you determine whether it is safe and beneficial for you to take aspirin daily. Taking aspirin daily may be helpful if you:  Have had a heart attack or chest pain.  Are at risk for a heart attack.  Have undergone open-heart surgery, such as coronary artery bypass surgery (CABG).  Have had coronary angioplasty or a  stent.  Have had certain types of stroke or transient ischemic attack (TIA).  Have peripheral artery disease (PAD).  Have chronic heart rhythm problems such as atrial fibrillation and cannot take an anticoagulant.  Have valve disease or have had surgery on a valve. What are the risks? Daily use of aspirin can cause side effects. Some of these include:  Bleeding. Bleeding problems can be minor or serious. An example of a minor problem is a cut that does not stop bleeding. An example of a more serious problem is stomach bleeding or, rarely, bleeding into the brain. Your risk of bleeding is increased if you are also taking non-steroidal anti-inflammatory drugs (NSAIDs).  Increased bruising.  Upset stomach.  An allergic reaction. People who have nasal polyps have an increased risk of developing an aspirin allergy. General guidelines  Take aspirin only as told by your health care provider. Make sure that you understand how much you should take and what form you should take. The two forms of aspirin are: ? Non-enteric-coated.This type of aspirin does not have a coating and is absorbed quickly. This type of aspirin also comes in a chewable form. ? Enteric-coated. This type of aspirin has a coating that releases the medicine very slowly. Enteric-coated aspirin might cause less stomach upset than non-enteric-coated aspirin. This type of aspirin should not be chewed or crushed.  Limit alcohol intake to no more than 1 drink a day for nonpregnant women and 2 drinks a day for men. Drinking alcohol increases your risk  of bleeding. One drink equals 12 oz of beer, 5 oz of wine, or 1 oz of hard liquor. Contact a health care provider if you:  Have unusual bleeding or bruising.  Have stomach pain or nausea.  Have ringing in your ears.  Have an allergic reaction that causes: ? Hives. ? Itchy skin. ? Swelling of the lips, tongue, or face. Get help right away if you:  Notice that your bowel  movements are bloody, dark red, or black in color.  Vomit or cough up blood.  Have blood in your urine.  Cough, have noisy breathing (wheeze), or feel short of breath.  Have chest pain, especially if the pain spreads to the arms, back, neck, or jaw.  Have a severe headache, or a headache with confusion, or dizziness. These symptoms may represent a serious problem that is an emergency. Do not wait to see if the symptoms will go away. Get medical help right away. Call your local emergency services (911 in the [place] Do not drive yourself to the hospital. Summary  Aspirin can be used to help reduce the risk of blood clots, heart attacks, and other heart-related problems.  Daily use of aspirin can increase your risk of side effects. Your health care provider will help you determine whether it is safe and beneficial for you to take aspirin daily.  Take aspirin only as told by your health care provider. Make sure that you understand how much you can take and what form you can take. This information is not intended to replace advice given to you by your health care provider. Make sure you discuss any questions you have with your health care provider. Document Released: 01/10/2008 Document Revised: 11/27/2016 Document Reviewed: 11/27/2016 Elsevier Interactive Patient Education  2019 [employer].

## 2018-03-22 NOTE — Telephone Encounter (Signed)
Called to let pt know that we are awaiting a call back regarding the tolerance of the repatha so that we can start the proper paperwork to get the pa approved for the year

## 2018-03-23 ENCOUNTER — Telehealth: Payer: Self-pay

## 2018-03-23 NOTE — Telephone Encounter (Signed)
Called left msg stating that the cholesterol wclinic would like to know how she tolerated the repatha med and if this is a med that we can get for pt and do pa for

## 2018-03-25 NOTE — Telephone Encounter (Signed)
Called pt to see how they are tolerating repatha. Pt states they have yet to pick up a sample and will come by tomorrow to pick up a sample at the [facility] office

## 2018-03-29 ENCOUNTER — Telehealth: Payer: Self-pay | Admitting: Pharmacist

## 2018-03-29 NOTE — Telephone Encounter (Signed)
Patient developed skin reaction ~ 15 minutes after Repatha injection.   Rheumatologist assessed skin rection to Praluent and recommended avoiding further re-challenge with medication due to allergic reaction.  PCSK9i not an alternative for this patient due to allergic reaction.    Will consider bempedoic acid as an alternative as soon as the product is available from manufacturer.

## 2018-09-29 ENCOUNTER — Other Ambulatory Visit: Payer: Self-pay | Admitting: Cardiology

## 2019-03-16 NOTE — Progress Notes (Signed)
Cardiology Office Note:    Date:  03/17/2019   ID:  Amanda Pham, DOB [birth year], MRN [id number]  PCP:  Drake Leach, MD  Cardiologist:  Shirlee More, MD    Referring MD: Drake Leach, MD    ASSESSMENT:    1. Chest pain of uncertain etiology   2. Coronary artery disease involving native coronary artery of native heart with angina pectoris ([place])   3. Essential hypertension   4. Hyperlipidemia LDL goal <70    PLAN:    In order of problems listed above:  1. Further evaluation with atypical chest pain and mild CAD myocardial perfusion study and no flow-limiting stenosis referral to coronary angiography. 2. Stable hypertension continue current treat 3. Severe dyslipidemia poorly controlled statin PCSK9 intolerant and allergic to propylene glycol.  We will try bempedoic acid in most individuals statin intolerant can tolerated with about a 25% reduction in LDL   Next appointment: 4 weeks   Medication Adjustments/Labs and Tests Ordered: Current medicines are reviewed at length with the patient today.  Concerns regarding medicines are outlined above.  Orders Placed This Encounter  Procedures  . MYOCARDIAL PERFUSION IMAGING  . EKG 12-Lead   No orders of the defined types were placed in this encounter.   Chief Complaint  Patient presents with  . Follow-up    Having problems with shortness of breath and chest pain    History of Present Illness:    Amanda Pham is a 79 y.o. female with a hx of  hypertension hyperlipidemia GERD and rheumatoid arthritis who underwent coronary angiography 05/15/2017 showing the presence of moderate CAD 40 to 60% tubular LAD stenosis 20 to 30% marginal stenosis and 50% ostial PDA treated medically.     She was last seen 03/22/2018. Compliance with diet, lifestyle and medications: Yes  She has severe dyslipidemia LDL was 188 on Zetia is statin intolerant and does not tolerate PCSK9 with allergy.  She is also allergic to propylene glycol.   Not doing as well once or twice a week she either has shortness of breath with or without activities or chest pressure with or without activity the episodes are brief a few minutes up to 10 and she has not required nitroglycerin.  This is made her apprehensive.  She does not have a prescription for nitroglycerin she is not taking aspirin I would have her schedule a myocardial perfusion study to quickly stratify risk if she has flow-limiting stenosis she would need coronary angiography resume aspirin screen for other problems including drawing a troponin if elevated treat like acute coronary syndrome and a D-dimer to screen for pulmonary embolism with her chronic inflammatory disorder I am in a research other drugs I was going to use a bile acid sequestrant but they have propylene glycol.  We will try bempedoic acid Past Medical History:  Diagnosis Date  . Arthritis   . Fibromyalgia   . Hyperlipidemia   . Hypertension   . IBS (irritable bowel syndrome)   . Knee osteonecrosis ([place])   . Osteopenia     Past Surgical History:  Procedure Laterality Date  . BILATERAL SALPINGOOPHORECTOMY Bilateral 1979   endometriosis  . CARPAL TUNNEL RELEASE Right 1989  . COLONOSCOPY W/ BIOPSIES  06/12/09   sigmoid diverticulitis  . ESOPHAGOGASTRODUODENOSCOPY ENDOSCOPY  06/12/09   gastric polyps X 25  . ESOPHAGOGASTRODUODENOSCOPY ENDOSCOPY  08/14/09   benign fundal polyps - no adenoma  . LEFT HEART CATH AND CORONARY ANGIOGRAPHY N/A 05/15/2017   Procedure: LEFT HEART  CATH AND CORONARY ANGIOGRAPHY;  Surgeon: Belva Crome, MD;  Location: [facility];  Service: Cardiovascular;  Laterality: N/A;  . 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 TOTAL ABDOMINAL HYSTERECTOMY  1978    Current Medications: Current Meds  Medication Sig  . citalopram (CELEXA) 20 MG tablet Take 0.5 tablets by mouth 2 (two) times daily.  . cycloSPORINE (RESTASIS) 0.05 % ophthalmic emulsion Place 1  drop into both eyes 2 (two) times daily.  Marland Kitchen ezetimibe (ZETIA) 10 MG tablet TAKE 1 TABLET BY MOUTH EVERY DAY  . fluconazole (DIFLUCAN) 150 MG tablet fluconazole 150 mg tablet  . hyoscyamine (ANASPAZ) 0.125 MG TBDP Place 0.125 mg under the tongue every 4 (four) hours as needed.   Marland Kitchen levocetirizine (XYZAL) 5 MG tablet TAKE 1 TABLET (5 MG TOTAL) BY MOUTH DAILY AS NEEDED.  Marland Kitchen lisinopril-hydrochlorothiazide (PRINZIDE,ZESTORETIC) 20-25 MG per tablet TAKE 1/2 TABLET TWICE A DAY  . nitroGLYCERIN (NITROSTAT) 0.6 MG SL tablet Place 0.6 tablets under the tongue as needed.  Marland Kitchen omeprazole (PRILOSEC) 40 MG capsule TAKE 1 CAPSULE (40 MG TOTAL) BY MOUTH DAILY.  . valACYclovir (VALTREX) 500 MG tablet TAKE 1 TABLET THREE TIMES A DAY AS NEEDED  . Vitamin D, Ergocalciferol, (DRISDOL) 50000 UNITS CAPS capsule Take 1 capsule (50,000 Units total) by mouth every 7 (seven) days.  . zoledronic acid (RECLAST) 5 MG/100ML SOLN injection Inject 5 mg into the vein once.     Allergies:   Ivp dye [iodinated diagnostic agents], Propylene glycol, Codeine, Other, Penicillins, Plaquenil [hydroxychloroquine sulfate], Sulfonamide derivatives, Adhesive [tape], and Praluent [alirocumab]   Social History   Socioeconomic History  . Marital status: Married    Spouse name: Not on file  . Number of children: 3  . Years of education: Not on file  . Highest education level: Not on file  Occupational History    Employer: RETIRED  Tobacco Use  . Smoking status: Never Smoker  . Smokeless tobacco: Never Used  Substance and Sexual Activity  . Alcohol use: No  . Drug use: No  . Sexual activity: Not on file  Other Topics Concern  . Not on file  Social History Narrative  . Not on file   Social Determinants of Health   Financial Resource Strain:   . Difficulty of Paying Living Expenses: Not on file  Food Insecurity:   . Worried About Charity fundraiser in the Last Year: Not on file  . Ran Out of Food in the Last Year: Not on file   Transportation Needs:   . Lack of Transportation (Medical): Not on file  . Lack of Transportation (Non-Medical): Not on file  Physical Activity:   . Days of Exercise per Week: Not on file  . Minutes of Exercise per Session: Not on file  Stress:   . Feeling of Stress : Not on file  Social Connections:   . Frequency of Communication with Friends and Family: Not on file  . Frequency of Social Gatherings with Friends and Family: Not on file  . Attends Religious Services: Not on file  . Active Member of Clubs or Organizations: Not on file  . Attends Archivist Meetings: Not on file  . Marital Status: Not on file     Family History: The patient's family history includes Breast cancer (age of onset: 90) in her sister. ROS:   Please see the history of present illness.    All other  systems reviewed and are negative.  EKGs/Labs/Other Studies Reviewed:    The following studies were reviewed today:  EKG:  EKG ordered today and personally reviewed.  The ekg ordered today demonstrates sinus rhythm incomplete right bundle branch block unremarkable EKG no ischemic changes  Recent Labs: No results found for requested labs within last 8760 hours.  Recent Lipid Panel    Component Value Date/Time   CHOL 221 (H) 02/08/2018 1706   TRIG 218 (H) 02/08/2018 1706   HDL 64 02/08/2018 1706   CHOLHDL 3.5 02/08/2018 1706   CHOLHDL 5 04/26/2010 1041   VLDL 47.0 (H) 04/26/2010 1041   LDLCALC 113 (H) 02/08/2018 1706   LDLDIRECT 171.5 04/26/2010 1041    Physical Exam:    VS:  BP 136/82   Pulse 66   Ht 5\' 5"  (1.651 m)   Wt 190 lb 6.4 oz (86.4 kg)   LMP 02/11/1976   SpO2 97%   BMI 31.68 kg/m     Wt Readings from Last 3 Encounters:  03/17/19 190 lb 6.4 oz (86.4 kg)  03/22/18 180 lb 12.8 oz (82 kg)  02/08/18 174 lb (78.9 kg)     GEN:  Well nourished, well developed in no acute distress HEENT: Normal NECK: No JVD; No carotid bruits LYMPHATICS: No lymphadenopathy CARDIAC: RRR, no  murmurs, rubs, gallops RESPIRATORY:  Clear to auscultation without rales, wheezing or rhonchi  ABDOMEN: Soft, non-tender, non-distended MUSCULOSKELETAL:  No edema; No deformity  SKIN: Warm and dry NEUROLOGIC:  Alert and oriented x 3 PSYCHIATRIC:  Normal affect    Signed, Shirlee More, MD  03/17/2019 12:03 PM    [place]

## 2019-03-17 ENCOUNTER — Encounter: Payer: Self-pay | Admitting: Cardiology

## 2019-03-17 ENCOUNTER — Ambulatory Visit (INDEPENDENT_AMBULATORY_CARE_PROVIDER_SITE_OTHER): Payer: Medicare PPO | Admitting: Cardiology

## 2019-03-17 ENCOUNTER — Other Ambulatory Visit: Payer: Self-pay

## 2019-03-17 VITALS — BP 136/82 | HR 66 | Ht 65.0 in | Wt 190.4 lb

## 2019-03-17 DIAGNOSIS — E785 Hyperlipidemia, unspecified: Secondary | ICD-10-CM

## 2019-03-17 DIAGNOSIS — I10 Essential (primary) hypertension: Secondary | ICD-10-CM

## 2019-03-17 DIAGNOSIS — R079 Chest pain, unspecified: Secondary | ICD-10-CM

## 2019-03-17 DIAGNOSIS — I25119 Atherosclerotic heart disease of native coronary artery with unspecified angina pectoris: Secondary | ICD-10-CM

## 2019-03-17 MED ORDER — ASPIRIN EC 81 MG PO TBEC: 81.0000 mg | DELAYED_RELEASE_TABLET | Freq: Every day | ORAL | 3 refills | Status: DC

## 2019-03-17 MED ORDER — BEMPEDOIC ACID 180 MG PO TABS: 1.0000 | ORAL_TABLET | ORAL | 3 refills | Status: AC

## 2019-03-17 MED ORDER — NITROGLYCERIN 0.4 MG SL SUBL: 0.4000 mg | SUBLINGUAL_TABLET | SUBLINGUAL | 4 refills | Status: DC | PRN

## 2019-03-17 NOTE — Patient Instructions (Signed)
Medication Instructions:  Your physician has recommended you make the following change in your medication:   START taking aspirin 81 mg(1 tablet) once daily  START taking nitroglycerin as needed for chest pain, When having chest pain, stop what you are doing and sit down. Take 1 nitro, wait 5 minutes. Still having chest pain, take 1 nitro, wait 5 minutes. Still having chest pain, take 1 nitro, dial 911. Total of 3 nitro in 15 minutes.   *If you need a refill on your cardiac medications before your next appointment, please call your pharmacy*  Lab Work: Your physician recommends that you have a Troponin, D-Dimer and ProBNP drawn today  If you have labs (blood work) drawn today and your tests are completely normal, you will receive your results only by: Marland Kitchen MyChart Message (if you have MyChart) OR . A paper copy in the mail If you have any lab test that is abnormal or we need to change your treatment, we will call you to review the results.  Testing/Procedures: You had an EKG Performed today  Your physician has requested that you have a lexiscan myoview. For further information please visit [email or web address]. Please follow instruction sheet, as given.    Follow-Up: At [facility], you and your health needs are our priority.  As part of our continuing mission to provide you with exceptional heart care, we have created designated Provider Care Teams.  These Care Teams include your primary Cardiologist (physician) and Advanced Practice Providers (APPs -  Physician Assistants and Nurse Practitioners) who all work together to provide you with the care you need, when you need it.  Your next appointment:   4 week(s)  The format for your next appointment:   In Person  Provider:   Shirlee More, MD  Other Instructions Bempedoic acid tablets What is this medicine? Bempedoic acid (BEM pe DOE ik AS id) is used to lower the level of cholesterol in the blood. It is used with other  cholesterol-lowering drugs. This medicine may be used for other purposes; ask your health care provider or pharmacist if you have questions. COMMON BRAND NAME(S): NEXLETOL What should I tell my health care provider before I take this medicine? They need to know if you have any of these conditions:  gout  kidney problems  liver problems  tendon problems  an unusual or allergic reaction to bempedoic acid, other medicines, foods, dyes, or preservatives  pregnant or trying to become pregnant  breast-feeding How should I use this medicine? Take this medicine by mouth with a glass of water. Follow the directions on the prescription label. You can take it with or without food. If it upsets your stomach, take it with food. Take your doses at regular intervals. Do not take your medicine more often than directed. Talk to your pediatrician about the use of this medicine in children. Special care may be needed. Overdosage: If you think you have taken too much of this medicine contact a poison control center or emergency room at once. NOTE: This medicine is only for you. Do not share this medicine with others. What if I miss a dose? If you miss a dose, take it as soon as you can. If it is almost time for your next dose, take only that dose. Do not take double or extra doses. What may interact with this medicine? This medicine may interact with the following medications:  simvastatin  pravastatin This list may not describe all possible interactions. Give your health care provider  a list of all the medicines, herbs, non-prescription drugs, or dietary supplements you use. Also tell them if you smoke, drink alcohol, or use illegal drugs. Some items may interact with your medicine. What should I watch for while using this medicine? Visit your health care professional for regular checks on your progress. Tell your health care professional if your symptoms do not start to get better or if they get  worse. You may need blood work done while you are taking this medicine. This drug is only part of a total heart-health program. Your doctor or a dietician can suggest a low-cholesterol and low-fat diet to help. Avoid alcohol and smoking, and keep a proper exercise schedule. What side effects may I notice from receiving this medicine? Side effects that you should report to your doctor or health care professional as soon as possible:  allergic reactions like skin rash, itching or hives, swelling of the face, lips, or tongue  signs of gout such as swollen, red, warm, or tender joints, especially in the toes  signs of tendon problems such as tendon pain or swelling or if you are unable to move a joint Side effects that usually do not require medical attention (report these to your doctor or health care professional if they continue or are bothersome):  back pain  cold or flu-like symptoms  muscle spasms  stomach pain This list may not describe all possible side effects. Call your doctor for medical advice about side effects. You may report side effects to FDA at 1-800-FDA-1088. Where should I keep my medicine? Keep out of the reach of children. Store at room temperature between 20 and 25 degrees C (68 and 77 degrees F). Keep this medicine in the original container. Do not throw out the packet in the container. It keeps the medicine dry. Throw away any unused medication after the expiration date. NOTE: This sheet is a summary. It may not cover all possible information. If you have questions about this medicine, talk to your doctor, pharmacist, or health care provider.  2020 Elsevier/Gold Standard (2018-04-07 16:19:16)  Aspirin and Your Heart  Aspirin is a medicine that prevents the cells in the blood that are used for clotting, called platelets, from sticking together. Aspirin can be used to help reduce the risk of blood clots, heart attacks, and other heart-related problems. Can I take  aspirin? Your health care provider will help you determine whether it is safe and beneficial for you to take aspirin daily. Taking aspirin daily may be helpful if you:  Have had a heart attack or chest pain.  Are at risk for a heart attack.  Have undergone open-heart surgery, such as coronary artery bypass surgery (CABG).  Have had coronary angioplasty or a stent.  Have had certain types of stroke or transient ischemic attack (TIA).  Have peripheral artery disease (PAD).  Have chronic heart rhythm problems such as atrial fibrillation and cannot take an anticoagulant.  Have valve disease or have had surgery on a valve. What are the risks? Daily use of aspirin can cause side effects. Some of these include:  Bleeding. Bleeding problems can be minor or serious. An example of a minor problem is a cut that does not stop bleeding. An example of a more serious problem is stomach bleeding or, rarely, bleeding into the brain. Your risk of bleeding is increased if you are also taking non-steroidal anti-inflammatory drugs (NSAIDs).  Increased bruising.  Upset stomach.  An allergic reaction. People who have nasal polyps  have an increased risk of developing an aspirin allergy. General guidelines  Take aspirin only as told by your health care provider. Make sure that you understand how much you should take and what form you should take. The two forms of aspirin are: ? Non-enteric-coated.This type of aspirin does not have a coating and is absorbed quickly. This type of aspirin also comes in a chewable form. ? Enteric-coated. This type of aspirin has a coating that releases the medicine very slowly. Enteric-coated aspirin might cause less stomach upset than non-enteric-coated aspirin. This type of aspirin should not be chewed or crushed.  Limit alcohol intake to no more than 1 drink a day for nonpregnant women and 2 drinks a day for men. Drinking alcohol increases your risk of bleeding. One drink  equals 12 oz of beer, 5 oz of wine, or 1 oz of hard liquor. Contact a health care provider if you:  Have unusual bleeding or bruising.  Have stomach pain or nausea.  Have ringing in your ears.  Have an allergic reaction that causes: ? Hives. ? Itchy skin. ? Swelling of the lips, tongue, or face. Get help right away if you:  Notice that your bowel movements are bloody, dark red, or black in color.  Vomit or cough up blood.  Have blood in your urine.  Cough, have noisy breathing (wheeze), or feel short of breath.  Have chest pain, especially if the pain spreads to the arms, back, neck, or jaw.  Have a severe headache, or a headache with confusion, or dizziness. These symptoms may represent a serious problem that is an emergency. Do not wait to see if the symptoms will go away. Get medical help right away. Call your local emergency services (911 in the [place] Do not drive yourself to the hospital. Summary  Aspirin can be used to help reduce the risk of blood clots, heart attacks, and other heart-related problems.  Daily use of aspirin can increase your risk of side effects. Your health care provider will help you determine whether it is safe and beneficial for you to take aspirin daily.  Take aspirin only as told by your health care provider. Make sure that you understand how much you can take and what form you can take. This information is not intended to replace advice given to you by your health care provider. Make sure you discuss any questions you have with your health care provider. Document Revised: 11/27/2016 Document Reviewed: 11/27/2016 Elsevier Patient Education  [place]. Nitroglycerin sublingual tablets O que  este medicamento? A NITROGLICERINA  um tipo de vasodilatador. Relaxa os vasos sanguneos, aumentando o suprimento de sangue e oxignio ao corao. Este medicamento  usado para Public house manager a dor no peito provocada pela angina. Tambm  usado para  prevenir a dor no peito antes de alguma atividade, como subir escadas, sair ao ar livre no frio, ou antes da atividade sexual. Cindra Presume medicamento pode ser usado para outros propsitos; em caso de dvidas, pergunte ao seu profissional de sade ou farmacutico. NOMES DE MARCAS COMUNS: Nitroquick, Nitrostat, Nitrotab O que devo dizer a meu profissional de sade antes de tomar este medicamento? Precisam saber se voc tem algum dos seguintes problemas ou estados de sade:  anemia  traumatismo craniano, derrame recente ou hemorragia cerebral  doenas hepticas  histria pregressa de ataque do corao  reao estranha ou alergia  nitroglicerina  reao estranha ou alergia a outros medicamentos  reao estranha ou alergia a alimentos, corantes ou conservantes  est  grvida ou tentando engravidar  est [place] devo usar este medicamento? Tome este medicamento por via oral conforme necessrio. Coloque um comprimido embaixo da lngua ao primeiro sinal de uma crise de angina (dor ou sensao de aperto no peito). Voc tambm pode tomar este medicamento 5 a 10 minutos antes de uma atividade que provavelmente causaria uma dor no peito. Siga as instrues na embalagem ou na bula. Deixe o comprimido Chief Operating Officer. No o engula inteiro. Se engolir acidentalmente o comprimido, tome outra dose. Ser mais fcil se a sua boca no Environmental consultant. A presena de saliva em volta do comprimido ajuda a dissolv-lo mais rapidamente. No coma, beba, fume ou masque fumo enquanto o comprimido estiver dissolvendo. Se no sentir uma melhora at 5 minutos depois de tomar UMA dose de nitroglicerina, ligue imediatamente para 9-1-1 e pea um atendimento aos servios de Careers information officer. No tome mais de 3 comprimidos de nitroglicerina ao longo de 15 minutos. Se toma este medicamento com frequncia para aliviar os sintomas da angina, seu mdico ou profissional de sade pode lhe dar instrues diferentes a respeito  de [employer] com os sintomas. Se os sintomas no passarem depois de seguir estas instrues,  importante que voc ligue imediatamente para 9-1-1. No tome mais de 3 comprimidos de nitroglicerina ao longo de 15 minutos. Fale com seu pediatra a respeito do uso deste medicamento em crianas. Pode ser preciso tomar alguns cuidados especiais. Superdosagem: Se achar que tomou uma superdosagem deste medicamento, entre em contato imediatamente com o [facility] Intoxicaes ou v a [employer]. OBSERVAO: Este medicamento  s para voc. No compartilhe este medicamento com outras pessoas. E se eu deixar de tomar uma dose? Isto no se aplica. Este medicamento s deve ser usado conforme necessrio. O que pode interagir com este medicamento? No tome este medicamento com nenhum dos seguintes:  alguns medicamentos para enxaqueca, como ergotamina e diidroergotamina (DHE)  alguns medicamentos para disfuno ertil, como sildenafila, tadalafila, vardenafila  riociguate Este medicamento tambm pode interagir com os seguintes remdios:  alteplase  aspirina  heparina  medicamentos para hipertenso  antidepressivos  outros medicamentos usados no tratamento da angina  fenotiaznicos, como clorpromazina, mesoridazina, proclorperazina e tioridazina Esta lista pode no descrever todas as interaes possveis. D ao seu profissional de sade uma lista de todos os medicamentos, ervas medicinais, remdios de venda livre, ou suplementos alimentares que voc [place]. Diga tambm se voc fuma, bebe, ou [place] drogas ilcitas. Alguns destes podem interagir com o seu medicamento. Ao que devo ficar atento quando estiver [employer] medicamento? Se sentir que o medicamento no est mais funcionando, avise seu mdico ou profissional de sade. Mantenha este medicamento consigo o tempo todo. Sente-se ou deite-se ao tomar [employer] para evitar quedas em caso de tontura ou desmaio depois de us-lo.  Tente manter a calma. Isso lhe ajudar a se sentir Medical illustrator. Em caso de tontura, respire fundo vrias vezes e deite-se com os ps para cima, ou ento incline-se para frente e apoie a cabea entre os joelhos. Voc pode sentir sonolncia ou tontura. No dirija, no opere mquinas e no faa nada que exija concentrao mental at [employer] como o medicamento lhe afeta. No se sente nem se levante rpido demais, principalmente se for um paciente idoso. Isso diminui o risco de [place] ou desmaio. O lcool pode piorar a tontura e a sonolncia. Evite bebidas alcolicas. Enquanto estiver [employer], no se automedique em caso de tosse, resfriado ou dores.  Pea orientao ao seu mdico ou profissional de sade. Alguns ingredientes podem aumentar a sua presso. Que efeitos colaterais posso sentir aps usar este medicamento? Efeitos colaterais que devem ser informados ao seu mdico ou profissional de sade o mais rpido possvel:  viso embaada  boca seca  erupo na pele  suores  sensao de fortssima presso na cabea  fraqueza ou cansao fora do comum Efeitos colaterais que normalmente no precisam de cuidados mdicos (avise ao seu mdico ou profissional de sade se persistirem ou forem incmodos):  rubor facial ou no pescoo  dor de cabea  batimento cardaco irregular  palpitaes  enjoo ou vmitos Esta lista pode no descrever todos os efeitos colaterais possveis. Para mais orientaes sobre efeitos colaterais, consulte o seu mdico. Voc pode relatar a ocorrncia de efeitos colaterais  FDA pelo telefone [phone]. Onde devo guardar meu medicamento? [facility] fora do [employer]. Conservar em temperatura ambiente, entre 20 e 25 degreesC [phone] e 77 degreesF). Conservar na embalagem original. [place]. Manter o recipiente bem fechado. Descartar qualquer medicamento no utilizado aps a data de validade impressa no rtulo ou  embalagem. OBSERVAO: Este folheto  um resumo. Pode no cobrir todas as informaes possveis. Se tiver dvidas a respeito deste medicamento, fale com seu mdico, farmacutico ou profissional de sade.  2020 Elsevier/Gold Standard (2016-02-28 00:00:00)  Cardiac Nuclear Scan A cardiac nuclear scan is a test that measures blood flow to the heart when a person is resting and when he or she is exercising. The test looks for problems such as:  Not enough blood reaching a portion of the heart.  The heart muscle not working normally. You may need this test if:  You have heart disease.  You have had abnormal lab results.  You have had heart surgery or a balloon procedure to open up blocked arteries (angioplasty).  You have chest pain.  You have shortness of breath. In this test, a radioactive dye (tracer) is injected into your bloodstream. After the tracer has traveled to your heart, an imaging device is used to measure how much of the tracer is absorbed by or distributed to various areas of your heart. This procedure is usually done at a hospital and takes 2-4 hours. Tell a health care provider about:  Any allergies you have.  All medicines you are taking, including vitamins, herbs, eye drops, creams, and over-the-counter medicines.  Any problems you or family members have had with anesthetic medicines.  Any blood disorders you have.  Any surgeries you have had.  Any medical conditions you have.  Whether you are pregnant or may be pregnant. What are the risks? Generally, this is a safe procedure. However, problems may occur, including:  Serious chest pain and heart attack. This is only a risk if the stress portion of the test is done.  Rapid heartbeat.  Sensation of warmth in your chest. This usually passes quickly.  Allergic reaction to the tracer. What happens before the procedure?  Ask your health care provider about changing or stopping your regular medicines. This is  especially important if you are taking diabetes medicines or blood thinners.  Follow instructions from your health care provider about eating or drinking restrictions.  Remove your jewelry on the day of the procedure. What happens during the procedure?  An IV will be inserted into one of your veins.  Your health care provider will inject a small amount of radioactive tracer through the IV.  You will wait for  20-40 minutes while the tracer travels through your bloodstream.  Your heart activity will be monitored with an electrocardiogram (ECG).  You will lie down on an exam table.  Images of your heart will be taken for about 15-20 minutes.  You may also have a stress test. For this test, one of the following may be done: ? You will exercise on a treadmill or stationary bike. While you exercise, your heart's activity will be monitored with an ECG, and your blood pressure will be checked. ? You will be given medicines that will increase blood flow to parts of your heart. This is done if you are unable to exercise.  When blood flow to your heart has peaked, a tracer will again be injected through the IV.  After 20-40 minutes, you will get back on the exam table and have more images taken of your heart.  Depending on the type of tracer used, scans may need to be repeated 3-4 hours later.  Your IV line will be removed when the procedure is over. The procedure may vary among health care providers and hospitals. What happens after the procedure?  Unless your health care provider tells you otherwise, you may return to your normal schedule, including diet, activities, and medicines.  Unless your health care provider tells you otherwise, you may increase your fluid intake. This will help to flush the contrast dye from your body. Drink enough fluid to keep your urine pale yellow.  Ask your health care provider, or the department that is doing the test: ? When will my results be  ready? ? How will I get my results? Summary  A cardiac nuclear scan measures the blood flow to the heart when a person is resting and when he or she is exercising.  Tell your health care provider if you are pregnant.  Before the procedure, ask your health care provider about changing or stopping your regular medicines. This is especially important if you are taking diabetes medicines or blood thinners.  After the procedure, unless your health care provider tells you otherwise, increase your fluid intake. This will help flush the contrast dye from your body.  After the procedure, unless your health care provider tells you otherwise, you may return to your normal schedule, including diet, activities, and medicines. This information is not intended to replace advice given to you by your health care provider. Make sure you discuss any questions you have with your health care provider. Document Revised: 07/13/2017 Document Reviewed: 07/13/2017 Elsevier Patient Education  [place] injection What is this medicine? REGADENOSON is used to test the heart for coronary artery disease. It is used in patients who can not exercise for their stress test. This medicine may be used for other purposes; ask your health care provider or pharmacist if you have questions. COMMON BRAND NAME(S): Lexiscan What should I tell my health care provider before I take this medicine? They need to know if you have any of these conditions:  heart problems  lung or breathing disease, like asthma or COPD  an unusual or allergic reaction to regadenoson, other medicines, foods, dyes, or preservatives  pregnant or trying to get pregnant  breast-feeding How should I use this medicine? This medicine is for injection into a vein. It is given by a health care professional in a hospital or clinic setting. Talk to your pediatrician regarding the use of this medicine in children. Special care may be  needed. Overdosage: If you think you have  taken too much of this medicine contact a poison control center or emergency room at once. NOTE: This medicine is only for you. Do not share this medicine with others. What if I miss a dose? This does not apply. What may interact with this medicine?  caffeine  dipyridamole  guarana  theophylline This list may not describe all possible interactions. Give your health care provider a list of all the medicines, herbs, non-prescription drugs, or dietary supplements you use. Also tell them if you smoke, drink alcohol, or use illegal drugs. Some items may interact with your medicine. What should I watch for while using this medicine? Your condition will be monitored carefully while you are receiving this medicine. Do not take medicines, foods, or drinks with caffeine (like coffee, tea, or colas) for at least 12 hours before your test. If you do not know if something contains caffeine, ask your health care professional. What side effects may I notice from receiving this medicine? Side effects that you should report to your doctor or health care professional as soon as possible:  allergic reactions like skin rash, itching or hives, swelling of the face, lips, or tongue  breathing problems  chest pain, tightness or palpitations  severe headache Side effects that usually do not require medical attention (report to your doctor or health care professional if they continue or are bothersome):  flushing  headache  irritation or pain at site where injected  nausea, vomiting This list may not describe all possible side effects. Call your doctor for medical advice about side effects. You may report side effects to FDA at 1-800-FDA-1088. Where should I keep my medicine? This drug is given in a hospital or clinic and will not be stored at home. NOTE: This sheet is a summary. It may not cover all possible information. If you have questions about this  medicine, talk to your doctor, pharmacist, or health care provider.  2020 Elsevier/Gold Standard (2007-09-27 15:08:13)

## 2019-03-17 NOTE — Addendum Note (Signed)
Addended by: Beckey Rutter on: 03/17/2019 02:17 PM   Modules accepted: Orders

## 2019-03-18 ENCOUNTER — Encounter (HOSPITAL_BASED_OUTPATIENT_CLINIC_OR_DEPARTMENT_OTHER)
Admission: RE | Admit: 2019-03-18 | Discharge: 2019-03-18 | Disposition: A | Discharge status: Home / Self Care | Payer: Medicare PPO | Source: Ambulatory Visit | Attending: Cardiology | Admitting: Cardiology

## 2019-03-18 ENCOUNTER — Telehealth: Payer: Self-pay | Admitting: Physician Assistant

## 2019-03-18 ENCOUNTER — Ambulatory Visit (HOSPITAL_COMMUNITY)
Admission: RE | Admit: 2019-03-18 | Discharge: 2019-03-18 | Disposition: A | Discharge status: Home / Self Care | Payer: Medicare PPO | Source: Ambulatory Visit | Attending: Cardiology | Admitting: Cardiology

## 2019-03-18 ENCOUNTER — Telehealth: Payer: Self-pay | Admitting: *Deleted

## 2019-03-18 DIAGNOSIS — R079 Chest pain, unspecified: Secondary | ICD-10-CM

## 2019-03-18 DIAGNOSIS — R0602 Shortness of breath: Secondary | ICD-10-CM | POA: Insufficient documentation

## 2019-03-18 LAB — PRO B NATRIURETIC PEPTIDE: NT-Pro BNP: 201 pg/mL (ref 0–738)

## 2019-03-18 LAB — D-DIMER, QUANTITATIVE: D-DIMER: 0.93 mg/L FEU — ABNORMAL HIGH (ref 0.00–0.49)

## 2019-03-18 LAB — TROPONIN I: Troponin I: <0.01 ng/mL (ref 0.00–0.04)

## 2019-03-18 MED ORDER — TECHNETIUM TO 99M ALBUMIN AGGREGATED
1.6400 | Freq: Once | INTRAVENOUS | Status: AC | PRN
  Administered 2019-03-18: 16:00:00 1.64 via INTRAVENOUS

## 2019-03-18 MED ORDER — PREDNISONE 50 MG PO TABS: ORAL_TABLET | ORAL | 0 refills | Status: DC

## 2019-03-18 NOTE — Addendum Note (Signed)
Addended by: Particia Nearing B on: 03/18/2019 12:22 PM   Modules accepted: Orders

## 2019-03-18 NOTE — Telephone Encounter (Signed)
Patient by Ludwig Clarks from [facility], he mentions patient will need premedication prior to CT next week. Will forward to Particia Nearing to arrange.

## 2019-03-18 NOTE — Addendum Note (Signed)
Addended by: Particia Nearing B on: 03/18/2019 02:40 PM   Modules accepted: Orders

## 2019-03-18 NOTE — Telephone Encounter (Signed)
Telephone call to patient. Informed of lab results and need for Ct chest to rule out PE,however she has a contrast allergy and needs premedication. Will send to Dr Bettina Gavia for advisement

## 2019-03-18 NOTE — Telephone Encounter (Signed)
Sitch to nuclear medicine lung scan, call result to Dr Harriet Masson on call today

## 2019-03-18 NOTE — Addendum Note (Signed)
Addended by: Particia Nearing B on: 03/18/2019 12:04 PM   Modules accepted: Miquel Dunn

## 2019-03-18 NOTE — Telephone Encounter (Signed)
-----   Message from Richardo Priest, MD sent at 03/18/2019  9:50 AM EST ----- Her D-dimer is significantly elevated, by itself not diagnostic but raises concern of pulmonary embolism I would like her to have cardiac CTA performed at [facility] she has a degree of CKD and unfortunately will need a quick BUN/creatinine check for contrast dye today

## 2019-03-18 NOTE — Telephone Encounter (Signed)
VQ scan ordered and preauth sent ASAP. Spoke with patient and is awaiting our call for the appointment time.

## 2019-03-18 NOTE — Addendum Note (Signed)
Addended by: Particia Nearing B on: 03/18/2019 02:15 PM   Modules accepted: Orders

## 2019-03-18 NOTE — Telephone Encounter (Signed)
Telephone call to patient. Explained that this test is only done at [facility] or [employer]. She decided [employer] radiology to schedule and they said she will also need a chest xray for this test. Order placed. They said she could come on to the hospital and I informed her to go on to [facility] radiology department on the 1st floor as soon as she can. Patient states she can be there in 20 minutes and is leaving now.

## 2019-03-18 NOTE — Addendum Note (Signed)
Addended by: Particia Nearing B on: 03/18/2019 05:16 PM   Modules accepted: Orders

## 2019-03-19 LAB — SPECIMEN STATUS REPORT

## 2019-03-19 LAB — BASIC METABOLIC PANEL
BUN/Creatinine Ratio: 18 (ref 12–28)
BUN: 23 mg/dL (ref 8–27)
CO2: 23 mmol/L (ref 20–29)
Calcium: 9.6 mg/dL (ref 8.7–10.3)
Chloride: 100 mmol/L (ref 96–106)
Creatinine, Ser: 1.31 mg/dL — ABNORMAL HIGH (ref 0.57–1.00)
GFR calc Af Amer: 45 mL/min/{1.73_m2} — ABNORMAL LOW (ref >59)
GFR calc non Af Amer: 39 mL/min/{1.73_m2} — ABNORMAL LOW (ref >59)
Glucose: 111 mg/dL — ABNORMAL HIGH (ref 65–99)
Potassium: 4 mmol/L (ref 3.5–5.2)
Sodium: 139 mmol/L (ref 134–144)

## 2019-03-21 ENCOUNTER — Telehealth (HOSPITAL_COMMUNITY): Payer: Self-pay

## 2019-03-21 ENCOUNTER — Telehealth: Payer: Self-pay | Admitting: Cardiology

## 2019-03-21 DIAGNOSIS — I10 Essential (primary) hypertension: Secondary | ICD-10-CM

## 2019-03-21 DIAGNOSIS — R600 Localized edema: Secondary | ICD-10-CM

## 2019-03-21 MED ORDER — APIXABAN 5 MG PO TABS: 5.0000 mg | ORAL_TABLET | Freq: Two times a day (BID) | ORAL | 3 refills | Status: DC

## 2019-03-21 NOTE — Telephone Encounter (Signed)
New Message  Larene Beach from Nuclear Medicine is calling in because patient's NM PULMONARY VENT AND PERF test needs to be rescheduled and COVID-19 test needs to be done as well. Please assist with this request.

## 2019-03-21 NOTE — Telephone Encounter (Signed)
Patient had left message on Nuclear Cardiology line over weekend in regards to CT scan and premedication.   Patient was contacted.   She explained her VQ scan from Friday was inconclusive and was told she would have a CT scan on Monday and someone would contact her.  She also stated that prednisone was prescribed and she had begun taking on Sunday in preparation for CT scan.    I explained to patient this was Nuclear Cardiology outpatient and not the correct department for either test.  In looking at her appointments, there is currently no CT scheduled.  I conveyed this to the patient.   And explained that the best course of action would be for her to contact Dr Bettina Gavia directly since he was the original ordering provider

## 2019-03-21 NOTE — Telephone Encounter (Signed)
Patient is calling stating she was told her test scheduled for 03/30/19 would be rescheduled for today 03/21/19 due to the results of her recent labs. Please advise

## 2019-03-21 NOTE — Telephone Encounter (Signed)
Spoke with Amanda Pham regarding Ct angio/PE. Pt has anaphylaxis with IV dye and protocol is that she can not have test per radiology. Spoke with Dr Bettina Gavia who reviewed VQ scan and said to treat her as if she has A PE and put her on Eliquis. Will get dosage from Dr Bettina Gavia and call in to pharmacy.CAlled patient to inform her that test is cancelled and will start Eliquis. Told her I will call as soon as Dr Bettina Gavia gives the dosage.

## 2019-03-21 NOTE — Addendum Note (Signed)
Addended by: Particia Nearing B on: 03/21/2019 04:25 PM   Modules accepted: Orders

## 2019-03-21 NOTE — Telephone Encounter (Signed)
Dr Bettina Gavia states get Bilateral DVT ultrasound,CBC and start Eliquis 5 mg TAke 1 tab twice daily. Pt showed up at office and this was explained to her. She asked that Dr Bettina Gavia please call her as she is very [phone]

## 2019-03-22 ENCOUNTER — Other Ambulatory Visit (HOSPITAL_COMMUNITY)
Admission: RE | Admit: 2019-03-22 | Discharge: 2019-03-22 | Disposition: A | Discharge status: Home / Self Care | Payer: Medicare PPO | Source: Ambulatory Visit | Attending: Cardiology | Admitting: Cardiology

## 2019-03-22 DIAGNOSIS — Z20822 Contact with and (suspected) exposure to covid-19: Secondary | ICD-10-CM | POA: Insufficient documentation

## 2019-03-22 LAB — CBC
Hematocrit: 45.1 % (ref 34.0–46.6)
Hemoglobin: 15.5 g/dL (ref 11.1–15.9)
MCH: 29.4 pg (ref 26.6–33.0)
MCHC: 34.4 g/dL (ref 31.5–35.7)
MCV: 86 fL (ref 79–97)
Platelets: 305 10*3/uL (ref 150–450)
RBC: 5.27 x10E6/uL (ref 3.77–5.28)
RDW: 13.1 % (ref 11.7–15.4)
WBC: 16.2 10*3/uL — ABNORMAL HIGH (ref 3.4–10.8)

## 2019-03-22 LAB — SARS CORONAVIRUS 2 (TAT 6-24 HRS): SARS Coronavirus 2: NEGATIVE

## 2019-03-22 NOTE — Progress Notes (Signed)
This Probation officer spoke with this pt this morning to inquire of when she will be having her VQ scan, as we have specific days we test for procedures. Pt sts she did not know when, due to the office waiting on her covid results. Pt sts she was tested on Sat 03/19/2019, and should be getting her results today. She sts that the office should accept the results. Pt asked to call back to this writer with the information if she will need to be tested at the [facility] site today. Pt given the phone number, and this writer is awaiting her call back.

## 2019-03-23 ENCOUNTER — Telehealth: Payer: Self-pay | Admitting: Cardiology

## 2019-03-23 ENCOUNTER — Telehealth (HOSPITAL_COMMUNITY): Payer: Self-pay | Admitting: *Deleted

## 2019-03-23 NOTE — Telephone Encounter (Signed)
New Message   Patient is calling in reference to scheduling her pulmonary function test. She states that Dr. Bettina Gavia stated that he should of gotten a call today about that. Patient is worried about not getting the test schedule. Please advise.

## 2019-03-23 NOTE — Telephone Encounter (Signed)
Patient given detailed instructions per Myocardial Perfusion Study Information Sheet for the test on 03/30/2019 at 1030. Patient notified to arrive 15 minutes early and that it is imperative to arrive on time for appointment to keep from having the test rescheduled.  If you need to cancel or reschedule your appointment, please call the office within 24 hours of your appointment. . Patient verbalized understanding.Ezeriah Luty, Ranae Palms Patient does not use the [employer]

## 2019-03-23 NOTE — Telephone Encounter (Signed)
I phoned her with the results of her Covid test and to confirm that she is set up for ventilation/perfusion lung scan.  On Monday I called the radiologist Tanda Rockers calls at [facility] reviewed the case he reaffirmed with me that they would not do chest CTA regarding pulmonary embolism with her history of anaphylaxis despite a steroid prep.  He recommended that she have a Covid test done and said that he would arrange for her to have a ventilation/perfusion lung scan performed afterwards.  When I called her she is received no contact and I sent a priority message to have her set up for ventilation/perfusion lung scan at [facility].  Also set up to have duplex of the lower extremities for DVT she reinforced with me today that she is decided not to take anticoagulant in the interim concerned about the bleeding  risk.  I reviewed with her the findings suggesting pulmonary embolism including elevated D-dimer shortness of breath and abnormal perfusion lung scan.  Hopefully duplex lower extremities and a full ventilation/perfusion lung scan, currently will give [place] adequate information to decide if she has high probability and should be anticoagulated.

## 2019-03-24 ENCOUNTER — Ambulatory Visit (HOSPITAL_COMMUNITY)
Admission: RE | Admit: 2019-03-24 | Discharge: 2019-03-24 | Disposition: A | Discharge status: Home / Self Care | Payer: Medicare PPO | Source: Ambulatory Visit | Attending: Cardiology | Admitting: Cardiology

## 2019-03-24 ENCOUNTER — Telehealth: Payer: Self-pay | Admitting: *Deleted

## 2019-03-24 ENCOUNTER — Other Ambulatory Visit: Payer: Self-pay

## 2019-03-24 DIAGNOSIS — R0602 Shortness of breath: Secondary | ICD-10-CM | POA: Diagnosis not present

## 2019-03-24 DIAGNOSIS — R079 Chest pain, unspecified: Secondary | ICD-10-CM | POA: Diagnosis not present

## 2019-03-24 MED ORDER — TECHNETIUM TO 99M ALBUMIN AGGREGATED
1.5600 | Freq: Once | INTRAVENOUS | Status: AC | PRN
  Administered 2019-03-24: 1.56 via INTRAVENOUS

## 2019-03-24 MED ORDER — TECHNETIUM TC 99M DIETHYLENETRIAME-PENTAACETIC ACID
41.0000 | Freq: Once | INTRAVENOUS | Status: AC | PRN
  Administered 2019-03-24: 41 via INTRAVENOUS

## 2019-03-24 NOTE — Telephone Encounter (Signed)
VQ SCAN AND CXR SCHEDULED TODAY AT 1:30 AND 2:00 PM PT AWARE .Adonis Housekeeper

## 2019-03-24 NOTE — Telephone Encounter (Signed)
Telephone call to patient. She had her negative COVID screen yesterday. A new VQ scan was ordered along with CXR (per radiology protocol).They will be able to do the complete VQ scan today. Appointment scheduled at 1:15 for a 1:30 appointment. Pt aware of appointment.

## 2019-03-24 NOTE — Addendum Note (Signed)
Addended by: Particia Nearing B on: 03/24/2019 08:18 AM   Modules accepted: Orders

## 2019-03-24 NOTE — Addendum Note (Signed)
Addended by: Particia Nearing B on: 03/24/2019 08:11 AM   Modules accepted: Orders

## 2019-03-25 ENCOUNTER — Telehealth: Payer: Self-pay | Admitting: Cardiology

## 2019-03-25 NOTE — Telephone Encounter (Signed)
Follow Up:     Pt wants to know if her Stress Test results aare ready from yesterday. She also wants to know if she need her Stress Test on 03-30-19?

## 2019-03-28 ENCOUNTER — Other Ambulatory Visit: Payer: Self-pay

## 2019-03-28 NOTE — Telephone Encounter (Signed)
Spoke with pt, she is very frustrated by the phone system and getting the run around with no real answers. Patient aware no blood clot in lungs and that is why she does not need the eliquis. She will have the scan tomorrow to see about DVT.

## 2019-03-30 ENCOUNTER — Telehealth (HOSPITAL_COMMUNITY): Payer: Self-pay | Admitting: *Deleted

## 2019-03-30 ENCOUNTER — Telehealth: Payer: Self-pay

## 2019-03-30 ENCOUNTER — Encounter (HOSPITAL_COMMUNITY): Payer: Medicare PPO

## 2019-03-30 DIAGNOSIS — R072 Precordial pain: Secondary | ICD-10-CM

## 2019-03-30 NOTE — Telephone Encounter (Signed)
Spoke to patient Dr.Munley advised to schedule lexiscan myoview.Scheduler will call back with appointment.

## 2019-03-30 NOTE — Telephone Encounter (Signed)
Patient given detailed instructions per Myocardial Perfusion Study Information Sheet for the test on 04/06/19 at 11:00. Patient notified to arrive 15 minutes early and that it is imperative to arrive on time for appointment to keep from having the test rescheduled.  If you need to cancel or reschedule your appointment, please call the office within 24 hours of your appointment. . Patient verbalized understanding.Amanda Pham

## 2019-03-31 ENCOUNTER — Other Ambulatory Visit: Payer: Self-pay | Admitting: Obstetrics and Gynecology

## 2019-03-31 ENCOUNTER — Ambulatory Visit (HOSPITAL_BASED_OUTPATIENT_CLINIC_OR_DEPARTMENT_OTHER): Admission: RE | Admit: 2019-03-31 | Payer: Medicare PPO | Source: Ambulatory Visit

## 2019-03-31 ENCOUNTER — Encounter (HOSPITAL_BASED_OUTPATIENT_CLINIC_OR_DEPARTMENT_OTHER): Payer: Medicare PPO

## 2019-03-31 DIAGNOSIS — Z1231 Encounter for screening mammogram for malignant neoplasm of breast: Secondary | ICD-10-CM

## 2019-04-01 ENCOUNTER — Ambulatory Visit (HOSPITAL_COMMUNITY)
Admission: RE | Admit: 2019-04-01 | Discharge: 2019-04-01 | Disposition: A | Discharge status: Home / Self Care | Payer: Medicare PPO | Source: Ambulatory Visit | Attending: Cardiology | Admitting: Cardiology

## 2019-04-01 ENCOUNTER — Other Ambulatory Visit: Payer: Self-pay

## 2019-04-01 DIAGNOSIS — R600 Localized edema: Secondary | ICD-10-CM | POA: Diagnosis not present

## 2019-04-06 ENCOUNTER — Other Ambulatory Visit: Payer: Self-pay

## 2019-04-06 ENCOUNTER — Ambulatory Visit (HOSPITAL_COMMUNITY): Payer: Medicare PPO | Attending: Internal Medicine

## 2019-04-06 VITALS — Ht 65.0 in | Wt 190.0 lb

## 2019-04-06 DIAGNOSIS — R072 Precordial pain: Secondary | ICD-10-CM

## 2019-04-06 DIAGNOSIS — I25119 Atherosclerotic heart disease of native coronary artery with unspecified angina pectoris: Secondary | ICD-10-CM | POA: Diagnosis present

## 2019-04-06 DIAGNOSIS — R079 Chest pain, unspecified: Secondary | ICD-10-CM | POA: Diagnosis present

## 2019-04-06 LAB — MYOCARDIAL PERFUSION IMAGING
LV dias vol: 65 mL (ref 46–106)
LV sys vol: 18 mL
Peak HR: 75 {beats}/min
Rest HR: 60 {beats}/min
SDS: 2
SRS: 1
SSS: 3
TID: 1.04

## 2019-04-06 MED ORDER — TECHNETIUM TC 99M TETROFOSMIN IV KIT
10.2000 | PACK | Freq: Once | INTRAVENOUS | Status: AC | PRN
  Administered 2019-04-06: 10.2 via INTRAVENOUS
  Filled 2019-04-06: qty 11

## 2019-04-06 MED ORDER — TECHNETIUM TC 99M TETROFOSMIN IV KIT
30.6000 | PACK | Freq: Once | INTRAVENOUS | Status: AC | PRN
  Administered 2019-04-06: 30.6 via INTRAVENOUS
  Filled 2019-04-06: qty 31

## 2019-04-06 MED ORDER — REGADENOSON 0.4 MG/5ML IV SOLN
0.4000 mg | Freq: Once | INTRAVENOUS | Status: AC
  Administered 2019-04-06: 0.4 mg via INTRAVENOUS

## 2019-04-07 ENCOUNTER — Telehealth: Payer: Self-pay | Admitting: *Deleted

## 2019-04-07 NOTE — Telephone Encounter (Signed)
-----   Message from Richardo Priest, MD sent at 04/07/2019  8:03 AM EST ----- Normal or stable result  Is a very good result normal I do not think she needs to undergo further testing like a heart catheterization and we can review in the office further at follow-up

## 2019-04-07 NOTE — Telephone Encounter (Signed)
Patient informed. Copy sent to PCP °

## 2019-04-16 NOTE — Progress Notes (Deleted)
Cardiology Office Note:    Date:  04/16/2019   ID:  Amanda Pham, DOB 01-27-1941, MRN [id number]  PCP:  Drake Leach, MD  Cardiologist:  Shirlee More, MD    Referring MD: Drake Leach, MD    ASSESSMENT:    No diagnosis found. PLAN:    In order of problems listed above:  1. ***   Next appointment: ***   Medication Adjustments/Labs and Tests Ordered: Current medicines are reviewed at length with the patient today.  Concerns regarding medicines are outlined above.  No orders of the defined types were placed in this encounter.  No orders of the defined types were placed in this encounter.   No chief complaint on file.   History of Present Illness:    Amanda Pham is a 79 y.o. female with a hx of hypertension hyperlipidemia GERD and rheumatoid arthritis who underwent coronary angiography 05/15/2017 showing the presence of moderate CAD 40 to 60% tubular LAD stenosis 20 to 30% marginal stenosis and 50% ostial PDA treated medically.      She was last seen 03/17/2019 with chest pressure and non exertional shortness of breath.. Compliance with diet, lifestyle and medications: ***  Ref Range & Units 1 mo ago   D-DIMER 0.00 - 0.49 mg/L FEU 0.93High     Ref Range & Units 1 mo ago  NT-Pro BNP 0 - 738 pg/mL 201    V/Q lung scan 03/24/2019: FINDINGS: Ventilation: Radiotracer uptake is homogeneous and symmetric bilaterally. No appreciable ventilation defects. Perfusion: Radiotracer uptake is homogeneous and symmetric bilaterally. No appreciable perfusion defects. IMPRESSION: No appreciable ventilation or perfusion defects. Very low probability of pulmonary embolus.  CXR 03/24/2019: FINDINGS: Cardiac silhouette is normal in size and configuration. No mediastinal or hilar masses. No evidence of adenopathy. Mild scarring at the lung apices.  Lungs otherwise clear. No pleural effusion or pneumothorax. Skeletal structures are demineralized but intact. No change  from the prior chest radiograph. IMPRESSION: No active cardiopulmonary disease.  Myoview 04/06/2019: Amanda Pham GATED SPECT MYO PERF [facility] STRESS 1D Order# [id number] Reading physician: Elouise Munroe, MD Ordering physician: Richardo Priest, MD Study date: 04/06/19  Patient Information  Name MRN Description  Amanda Pham [id number] 79 y.o. female  Myocardial Perfusion Imaging: Result Notes  [name], RN  04/07/2019 11:00 AM EST    Patient informed. Copy sent to PCP   Richardo Priest, MD  04/07/2019 8:03 AM EST    Normal or stable result  Is a very good result normal I do not think she needs to undergo further testing like a heart catheterization and we can review in the office further at follow-up      MyChart Results Release  MyChart Status: Active Results Release  Vitals  Height Weight BMI (Calculated)  5\' 5"  (1.651 m) 190 lb (86.2 kg) 31.62  Study Highlights   The left ventricular ejection fraction is hyperdynamic (>65%).  Nuclear stress EF: 72%.  There was no ST segment deviation noted during stress.  No T wave inversion was noted during stress.  The study is normal.  This is a low risk study.   Normal perfusion without evidence of ischemia or infarction.    Past Medical History:  Diagnosis Date  . Arthritis   . Fibromyalgia   . Hyperlipidemia   . Hypertension   . IBS (irritable bowel syndrome)   . Knee osteonecrosis ([place])   . Osteopenia     Past Surgical History:  Procedure Laterality  Date  . BILATERAL SALPINGOOPHORECTOMY Bilateral 1979   endometriosis  . CARPAL TUNNEL RELEASE Right 1989  . COLONOSCOPY W/ BIOPSIES  06/12/09   sigmoid diverticulitis  . ESOPHAGOGASTRODUODENOSCOPY ENDOSCOPY  06/12/09   gastric polyps X 25  . ESOPHAGOGASTRODUODENOSCOPY ENDOSCOPY  08/14/09   benign fundal polyps - no adenoma  . LEFT HEART CATH AND CORONARY ANGIOGRAPHY N/A 05/15/2017   Procedure: LEFT HEART CATH AND CORONARY ANGIOGRAPHY;   Surgeon: Belva Crome, MD;  Location: [facility];  Service: Cardiovascular;  Laterality: N/A;  . 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 TOTAL ABDOMINAL HYSTERECTOMY  1978    Current Medications: No outpatient medications have been marked as taking for the 04/18/19 encounter (Appointment) with Richardo Priest, MD.     Allergies:   Ivp dye [iodinated diagnostic agents], Propylene glycol, Codeine, Other, Penicillins, Plaquenil [hydroxychloroquine sulfate], Sulfonamide derivatives, Adhesive [tape], and Praluent [alirocumab]   Social History   Socioeconomic History  . Marital status: Widowed    Spouse name: Not on file  . Number of children: 3  . Years of education: Not on file  . Highest education level: Not on file  Occupational History    Employer: RETIRED  Tobacco Use  . Smoking status: Never Smoker  . Smokeless tobacco: Never Used  Substance and Sexual Activity  . Alcohol use: No  . Drug use: No  . Sexual activity: Not on file  Other Topics Concern  . Not on file  Social History Narrative  . Not on file   Social Determinants of Health   Financial Resource Strain:   . Difficulty of Paying Living Expenses: Not on file  Food Insecurity:   . Worried About Charity fundraiser in the Last Year: Not on file  . Ran Out of Food in the Last Year: Not on file  Transportation Needs:   . Lack of Transportation (Medical): Not on file  . Lack of Transportation (Non-Medical): Not on file  Physical Activity:   . Days of Exercise per Week: Not on file  . Minutes of Exercise per Session: Not on file  Stress:   . Feeling of Stress : Not on file  Social Connections:   . Frequency of Communication with Friends and Family: Not on file  . Frequency of Social Gatherings with Friends and Family: Not on file  . Attends Religious Services: Not on file  . Active Member of Clubs or Organizations: Not on file  . Attends Archivist  Meetings: Not on file  . Marital Status: Not on file     Family History: The patient's ***family history includes Breast cancer (age of onset: 9) in her sister. ROS:   Please see the history of present illness.    All other systems reviewed and are negative.  EKGs/Labs/Other Studies Reviewed:    The following studies were reviewed today:  EKG:  EKG ordered today and personally reviewed.  The ekg ordered today demonstrates ***  Recent Labs: 03/17/2019: BUN 23; Creatinine, Ser 1.31; NT-Pro BNP 201; Potassium 4.0; Sodium 139 03/21/2019: Hemoglobin 15.5; Platelets 305  Recent Lipid Panel    Component Value Date/Time   CHOL 221 (H) 02/08/2018 1706   TRIG 218 (H) 02/08/2018 1706   HDL 64 02/08/2018 1706   CHOLHDL 3.5 02/08/2018 1706   CHOLHDL 5 04/26/2010 1041   VLDL 47.0 (H) 04/26/2010 1041   LDLCALC 113 (H) 02/08/2018 1706   LDLDIRECT 171.5  04/26/2010 1041    Physical Exam:    VS:  LMP 02/11/1976     Wt Readings from Last 3 Encounters:  04/06/19 190 lb (86.2 kg)  03/17/19 190 lb 6.4 oz (86.4 kg)  03/22/18 180 lb 12.8 oz (82 kg)     GEN: *** Well nourished, well developed in no acute distress HEENT: Normal NECK: No JVD; No carotid bruits LYMPHATICS: No lymphadenopathy CARDIAC: ***RRR, no murmurs, rubs, gallops RESPIRATORY:  Clear to auscultation without rales, wheezing or rhonchi  ABDOMEN: Soft, non-tender, non-distended MUSCULOSKELETAL:  No edema; No deformity  SKIN: Warm and dry NEUROLOGIC:  Alert and oriented x 3 PSYCHIATRIC:  Normal affect    Signed, Shirlee More, MD  04/16/2019 10:22 AM    [facility]

## 2019-04-18 ENCOUNTER — Ambulatory Visit: Payer: Medicare PPO | Admitting: Cardiology

## 2019-04-19 ENCOUNTER — Other Ambulatory Visit: Payer: Self-pay

## 2019-04-19 ENCOUNTER — Ambulatory Visit: Payer: Medicare PPO | Admitting: Cardiology

## 2019-04-19 ENCOUNTER — Encounter: Payer: Self-pay | Admitting: Cardiology

## 2019-04-19 VITALS — BP 130/62 | HR 63 | Temp 97.9°F | Ht 65.0 in | Wt 191.0 lb

## 2019-04-19 DIAGNOSIS — E785 Hyperlipidemia, unspecified: Secondary | ICD-10-CM | POA: Diagnosis not present

## 2019-04-19 DIAGNOSIS — I25119 Atherosclerotic heart disease of native coronary artery with unspecified angina pectoris: Secondary | ICD-10-CM

## 2019-04-19 DIAGNOSIS — R0602 Shortness of breath: Secondary | ICD-10-CM | POA: Diagnosis not present

## 2019-04-19 DIAGNOSIS — I10 Essential (primary) hypertension: Secondary | ICD-10-CM | POA: Diagnosis not present

## 2019-04-19 NOTE — Patient Instructions (Signed)
Medication Instructions:  ?Your physician recommends that you continue on your current medications as directed. Please refer to the Current Medication list given to you today. ? ?*If you need a refill on your cardiac medications before your next appointment, please call your pharmacy* ? ? ?Lab Work: ?NONE ?If you have labs (blood work) drawn today and your tests are completely normal, you will receive your results only by: ?MyChart Message (if you have MyChart) OR ?A paper copy in the mail ?If you have any lab test that is abnormal or we need to change your treatment, we will call you to review the results. ? ? ?Testing/Procedures: ?NONE ? ? ?Follow-Up: ?At [facility], you and your health needs are our priority.  As part of our continuing mission to provide you with exceptional heart care, we have created designated Provider Care Teams.  These Care Teams include your primary Cardiologist (physician) and Advanced Practice Providers (APPs -  Physician Assistants and Nurse Practitioners) who all work together to provide you with the care you need, when you need it. ? ?We recommend signing up for the patient portal called "MyChart".  Sign up information is provided on this After Visit Summary.  MyChart is used to connect with patients for Virtual Visits (Telemedicine).  Patients are able to view lab/test results, encounter notes, upcoming appointments, etc.  Non-urgent messages can be sent to your provider as well.   ?To learn more about what you can do with MyChart, go to [email or web address].   ? ?Your next appointment:   ?6 month(s) ? ?The format for your next appointment:   ?In Person ? ?Provider:   ?Brian Munley, MD  ? ? ?Other Instructions ?  ?

## 2019-04-19 NOTE — Progress Notes (Signed)
Cardiology Office Note:    Date:  04/19/2019   ID:  Amanda Pham, DOB 16-Jan-1941, MRN [id number]  PCP:  Drake Leach, MD  Cardiologist:  Shirlee More, MD    Referring MD: Drake Leach, MD    ASSESSMENT:    1. Coronary artery disease involving native coronary artery of native heart with angina pectoris ([place])   2. Essential hypertension   3. Hyperlipidemia LDL goal <70   4. Shortness of breath    PLAN:    In order of problems listed above:  1. Stable she has mild CAD atypical symptoms normal perfusion study I would not advise coronary angiography especially as at this time she is even intolerant of aspirin with GI upset. 2. Stable BP at target continue treatment including ACE inhibitor thiazide diuretic 3. She is statin intolerant along with PCSK9 continue Zetia and I told her the new messenger RNA inhibitor coming on the market in the future would be a good choice.  We will reassess in 6 months 4. Her shortness of breath does not appear to be heart failure coronary ischemia or venous thromboembolism she has scarring on her chest x-ray and I suspect she has an element of chronic lung disease likely the etiology her symptoms are not severe and at this time I would do further evaluation   Next appointment: 6 months   Medication Adjustments/Labs and Tests Ordered: Current medicines are reviewed at length with the patient today.  Concerns regarding medicines are outlined above.  No orders of the defined types were placed in this encounter.  No orders of the defined types were placed in this encounter.   No chief complaint on file.   History of Present Illness:    Amanda Pham is a 79 y.o. female with a hx of hypertension hyperlipidemia GERD and rheumatoid arthritis who underwent coronary angiography 05/15/2017 showing the presence of moderate CAD 40 to 60% tubular LAD stenosis 20 to 30% marginal stenosis and 50% ostial PDA treated medically.      He was last seen  05/2019 and had a very extensive evaluation.  Had an elevated D-dimer and underwent a ventilation/perfusion lung scan low probability.  Subsequent Myoview perfusion study 04/06/2019 normal no evidence of ischemia right lower extremity venous duplex 04/01/2019 normal chest x-ray 03/24/2019 with mild scarring of the lungs otherwise normal. Compliance with diet, lifestyle and medications: Yes, she stopped taking aspirin with GI upset.  Is a wide-ranging visit she shows me an area on her left flank where she has a hyperpigmented lesion I advised her to see dermatology with a change in its appearance.  She had herpes zoster with quick resolution.  Presently not short of breath but she has a background history of what she describes as lung scarring and intermittent shortness of breath.  She is not having typical angina at this time.  Her myocardial perfusion study showed no ischemia and I would not pursue coronary angiography and extensive evaluation shows no evidence of venous thromboembolism.  Her elevated D-dimer is likely due to underlying arthritis.  No edema orthopnea chest pain palpitation or syncope. Past Medical History:  Diagnosis Date  . Arthritis   . Fibromyalgia   . Hyperlipidemia   . Hypertension   . IBS (irritable bowel syndrome)   . Knee osteonecrosis ([place])   . Osteopenia     Past Surgical History:  Procedure Laterality Date  . BILATERAL SALPINGOOPHORECTOMY Bilateral 1979   endometriosis  . CARPAL TUNNEL RELEASE Right 1989  .  COLONOSCOPY W/ BIOPSIES  06/12/09   sigmoid diverticulitis  . ESOPHAGOGASTRODUODENOSCOPY ENDOSCOPY  06/12/09   gastric polyps X 25  . ESOPHAGOGASTRODUODENOSCOPY ENDOSCOPY  08/14/09   benign fundal polyps - no adenoma  . LEFT HEART CATH AND CORONARY ANGIOGRAPHY N/A 05/15/2017   Procedure: LEFT HEART CATH AND CORONARY ANGIOGRAPHY;  Surgeon: Belva Crome, MD;  Location: [facility];  Service: Cardiovascular;  Laterality: N/A;  . REPLACEMENT TOTAL KNEE Left  5/08   [facility] - had comlications of seizure and CVA  . TOTAL ABDOMINAL HYSTERECTOMY  1978    Current Medications: Current Meds  Medication Sig  . citalopram (CELEXA) 20 MG tablet Take 0.5 tablets by mouth 2 (two) times daily.  . cycloSPORINE (RESTASIS) 0.05 % ophthalmic emulsion Place 1 drop into both eyes 2 (two) times daily.  Marland Kitchen ezetimibe (ZETIA) 10 MG tablet TAKE 1 TABLET BY MOUTH EVERY DAY  . fluconazole (DIFLUCAN) 150 MG tablet fluconazole 150 mg tablet  . gabapentin (NEURONTIN) 100 MG capsule   . hyoscyamine (ANASPAZ) 0.125 MG TBDP Place 0.125 mg under the tongue every 4 (four) hours as needed.   Marland Kitchen lisinopril-hydrochlorothiazide (PRINZIDE,ZESTORETIC) 20-25 MG per tablet TAKE 1/2 TABLET TWICE A DAY  . nitroGLYCERIN (NITROSTAT) 0.4 MG SL tablet Place 1 tablet (0.4 mg total) under the tongue as needed.  Marland Kitchen omeprazole (PRILOSEC) 40 MG capsule TAKE 1 CAPSULE (40 MG TOTAL) BY MOUTH DAILY.  Marland Kitchen Tocilizumab (ACTEMRA IV) Inject into the vein. Once every 4 weeks  . valACYclovir (VALTREX) 500 MG tablet TAKE 1 TABLET THREE TIMES A DAY AS NEEDED  . Vitamin D, Ergocalciferol, (DRISDOL) 50000 UNITS CAPS capsule Take 1 capsule (50,000 Units total) by mouth every 7 (seven) days.  . zoledronic acid (RECLAST) 5 MG/100ML SOLN injection Inject 5 mg into the vein once.     Allergies:   Ivp dye [iodinated diagnostic agents], Propylene glycol, Codeine, Other, Penicillins, Plaquenil [hydroxychloroquine sulfate], Sulfonamide derivatives, Adhesive [tape], and Praluent [alirocumab]   Social History   Socioeconomic History  . Marital status: Widowed    Spouse name: Not on file  . Number of children: 3  . Years of education: Not on file  . Highest education level: Not on file  Occupational History    Employer: RETIRED  Tobacco Use  . Smoking status: Never Smoker  . Smokeless tobacco: Never Used  Substance and Sexual Activity  . Alcohol use: No  . Drug use: No  . Sexual activity: Not on file  Other Topics  Concern  . Not on file  Social History Narrative  . Not on file   Social Determinants of Health   Financial Resource Strain:   . Difficulty of Paying Living Expenses: Not on file  Food Insecurity:   . Worried About Charity fundraiser in the Last Year: Not on file  . Ran Out of Food in the Last Year: Not on file  Transportation Needs:   . Lack of Transportation (Medical): Not on file  . Lack of Transportation (Non-Medical): Not on file  Physical Activity:   . Days of Exercise per Week: Not on file  . Minutes of Exercise per Session: Not on file  Stress:   . Feeling of Stress : Not on file  Social Connections:   . Frequency of Communication with Friends and Family: Not on file  . Frequency of Social Gatherings with Friends and Family: Not on file  . Attends Religious Services: Not on file  . Active Member of Clubs or  Organizations: Not on file  . Attends Archivist Meetings: Not on file  . Marital Status: Not on file     Family History: The patient's family history includes Breast cancer (age of onset: 75) in her sister. ROS:   Please see the history of present illness.    All other systems reviewed and are negative.  EKGs/Labs/Other Studies Reviewed:    The following studies were reviewed today:    Recent Labs: 03/17/2019: BUN 23; Creatinine, Ser 1.31; NT-Pro BNP 201; Potassium 4.0; Sodium 139 03/21/2019: Hemoglobin 15.5; Platelets 305  Recent Lipid Panel    Component Value Date/Time   CHOL 221 (H) 02/08/2018 1706   TRIG 218 (H) 02/08/2018 1706   HDL 64 02/08/2018 1706   CHOLHDL 3.5 02/08/2018 1706   CHOLHDL 5 04/26/2010 1041   VLDL 47.0 (H) 04/26/2010 1041   LDLCALC 113 (H) 02/08/2018 1706   LDLDIRECT 171.5 04/26/2010 1041    Physical Exam:    VS:  BP 130/62   Pulse 63   Temp 97.9 F (36.6 C)   Ht 5\' 5"  (1.651 m)   Wt 191 lb (86.6 kg)   LMP 02/11/1976   SpO2 95%   BMI 31.78 kg/m     Wt Readings from Last 3 Encounters:  04/19/19 191 lb  (86.6 kg)  04/06/19 190 lb (86.2 kg)  03/17/19 190 lb 6.4 oz (86.4 kg)     GEN:  Well nourished, well developed in no acute distress HEENT: Normal NECK: No JVD; No carotid bruits LYMPHATICS: No lymphadenopathy CARDIAC: RRR, no murmurs, rubs, gallops RESPIRATORY:  Clear to auscultation without rales, wheezing or rhonchi  ABDOMEN: Soft, non-tender, non-distended MUSCULOSKELETAL:  No edema; No deformity  SKIN: Warm and dry NEUROLOGIC:  Alert and oriented x 3 PSYCHIATRIC:  Normal affect    Signed, Shirlee More, MD  04/19/2019 4:39 PM    [facility]

## 2019-05-16 ENCOUNTER — Ambulatory Visit: Payer: Medicare PPO

## 2019-06-16 ENCOUNTER — Other Ambulatory Visit: Payer: Self-pay

## 2019-06-16 ENCOUNTER — Ambulatory Visit: Payer: Medicare PPO

## 2019-06-16 ENCOUNTER — Ambulatory Visit
Admission: RE | Admit: 2019-06-16 | Discharge: 2019-06-16 | Disposition: A | Discharge status: Home / Self Care | Payer: Medicare PPO | Source: Ambulatory Visit | Attending: Obstetrics and Gynecology | Admitting: Obstetrics and Gynecology

## 2019-06-16 DIAGNOSIS — Z1231 Encounter for screening mammogram for malignant neoplasm of breast: Secondary | ICD-10-CM

## 2019-08-19 ENCOUNTER — Telehealth: Payer: Self-pay | Admitting: Cardiology

## 2019-08-19 ENCOUNTER — Telehealth: Payer: Self-pay | Admitting: Internal Medicine

## 2019-08-19 NOTE — Telephone Encounter (Signed)
Notified patient.

## 2019-08-19 NOTE — Telephone Encounter (Signed)
Patient's PCP retired. She states Dr. Bettina Gavia had the name of a female PCP in the [facility] system who he recommended but she cannot remember the name. Please advise.

## 2019-08-19 NOTE — Telephone Encounter (Signed)
Patient states that she would like to establish care with you , Patient states her friend Amanda Pham sees you as PCP.Patient has RA and you come highly recommend by her friend.    Please Advise

## 2019-08-21 NOTE — Telephone Encounter (Signed)
[facility] second floor [facility] [place] has several good primary care physician female practitioners.

## 2019-10-05 IMAGING — CR DG FOOT COMPLETE 3+V*L*
3 series · 3 of 3 positions shown · non-contrast
Comparison: None.

CLINICAL DATA: 76-year-old female with fall and left foot pain

EXAM:
LEFT FOOT - COMPLETE 3+ VIEW

[foot ap]
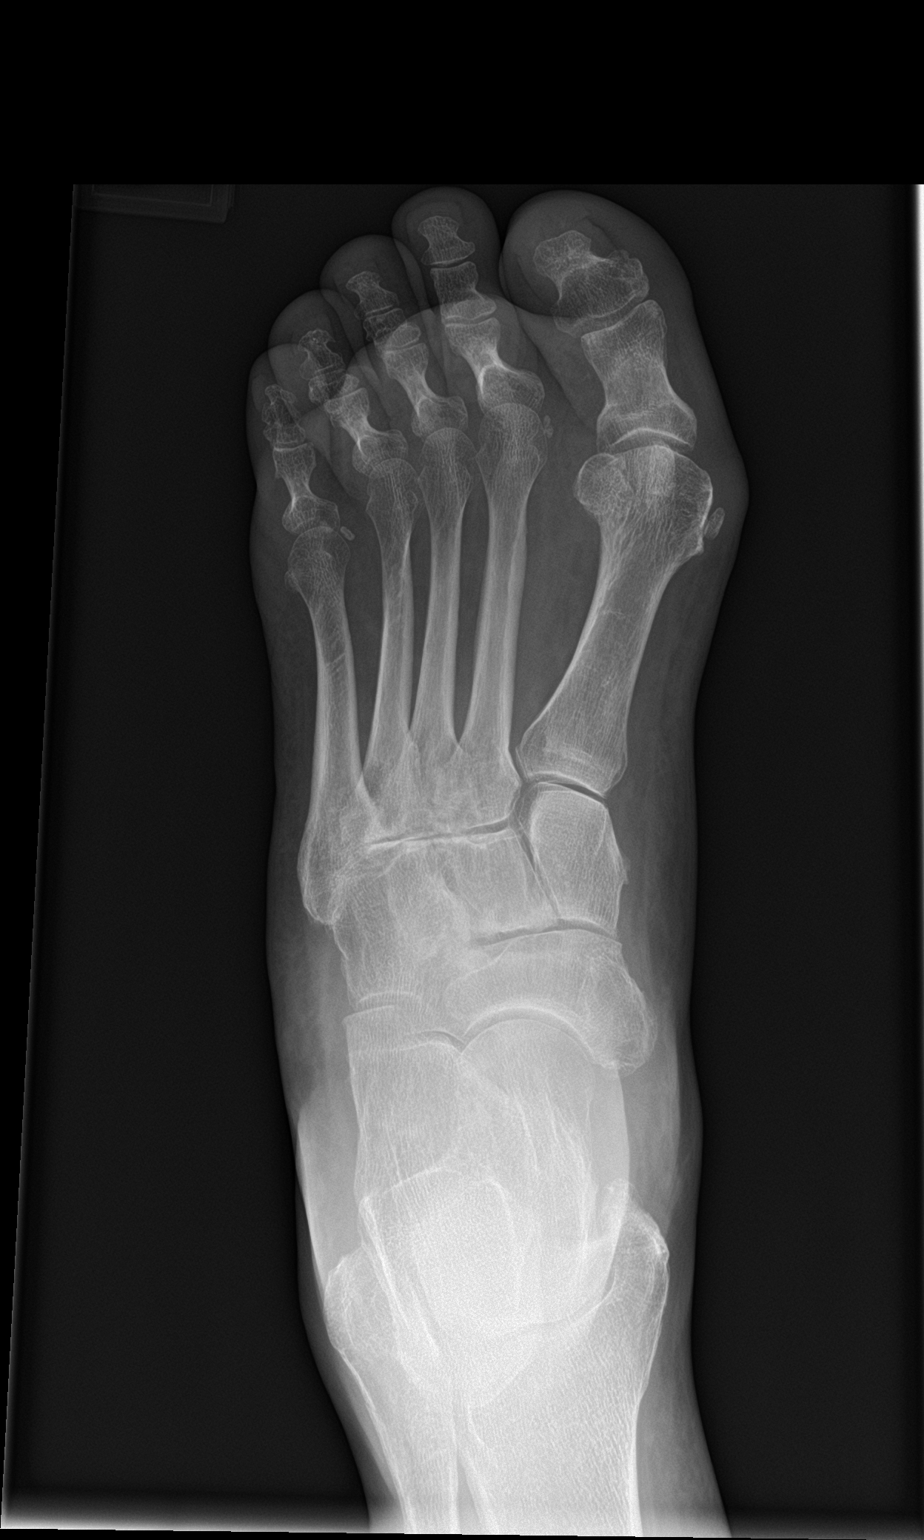

[foot obl]
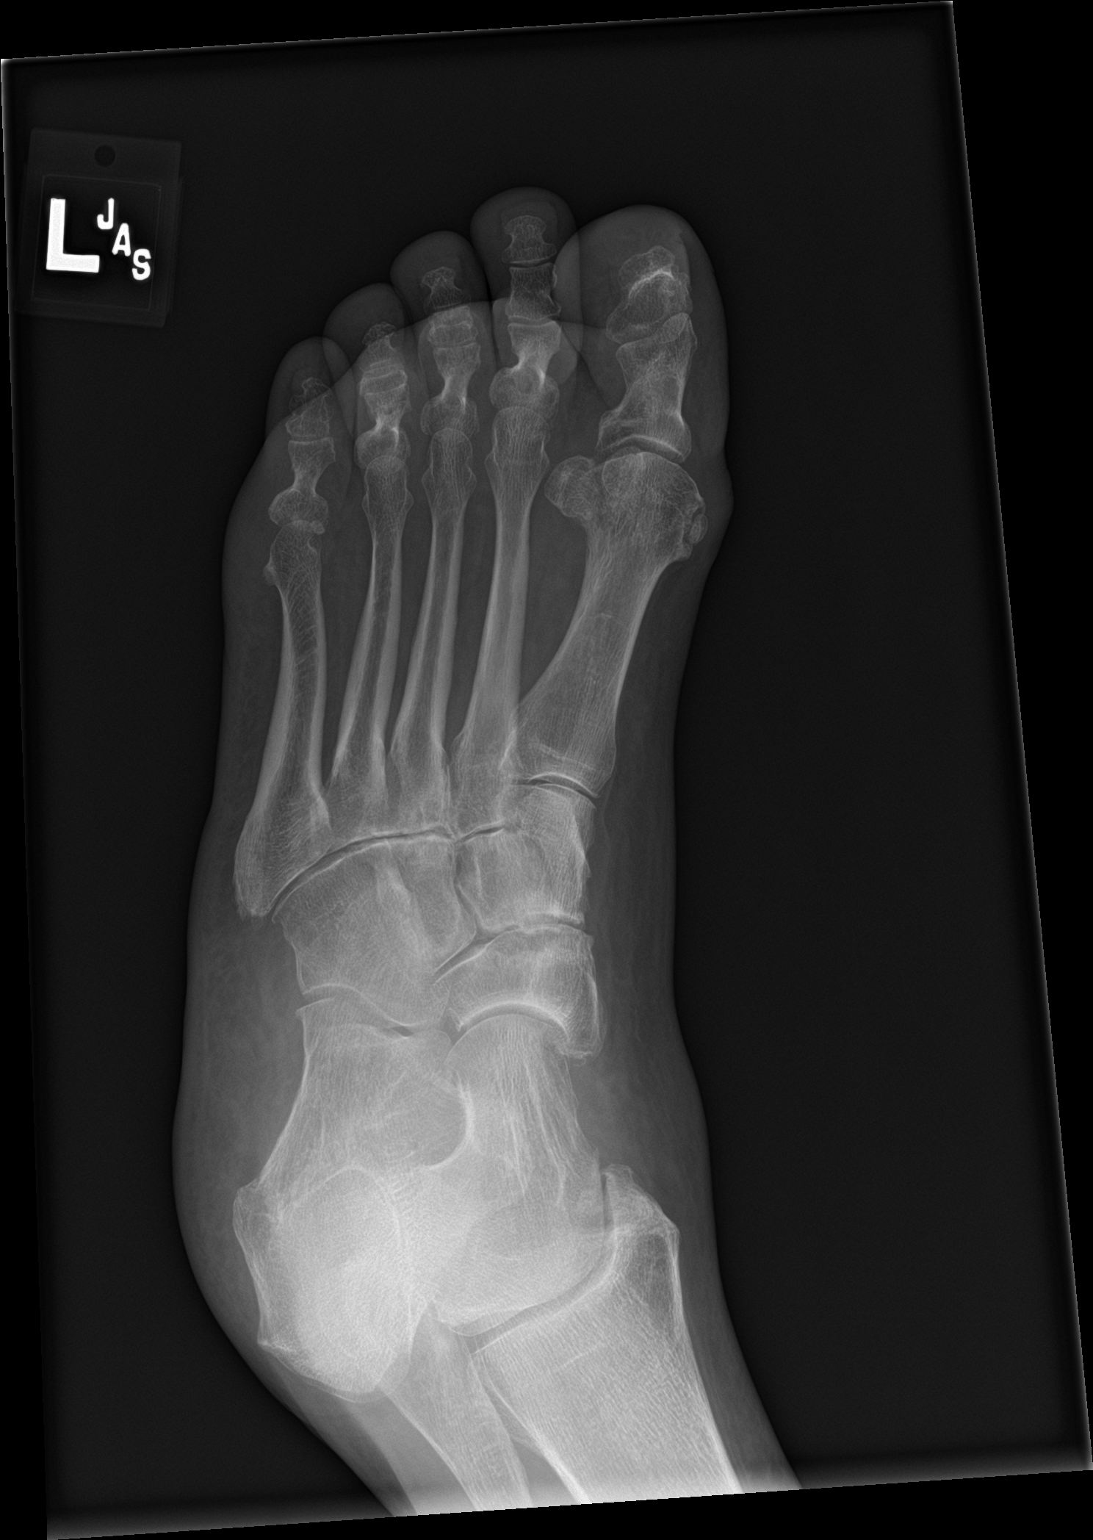

[foot lat]
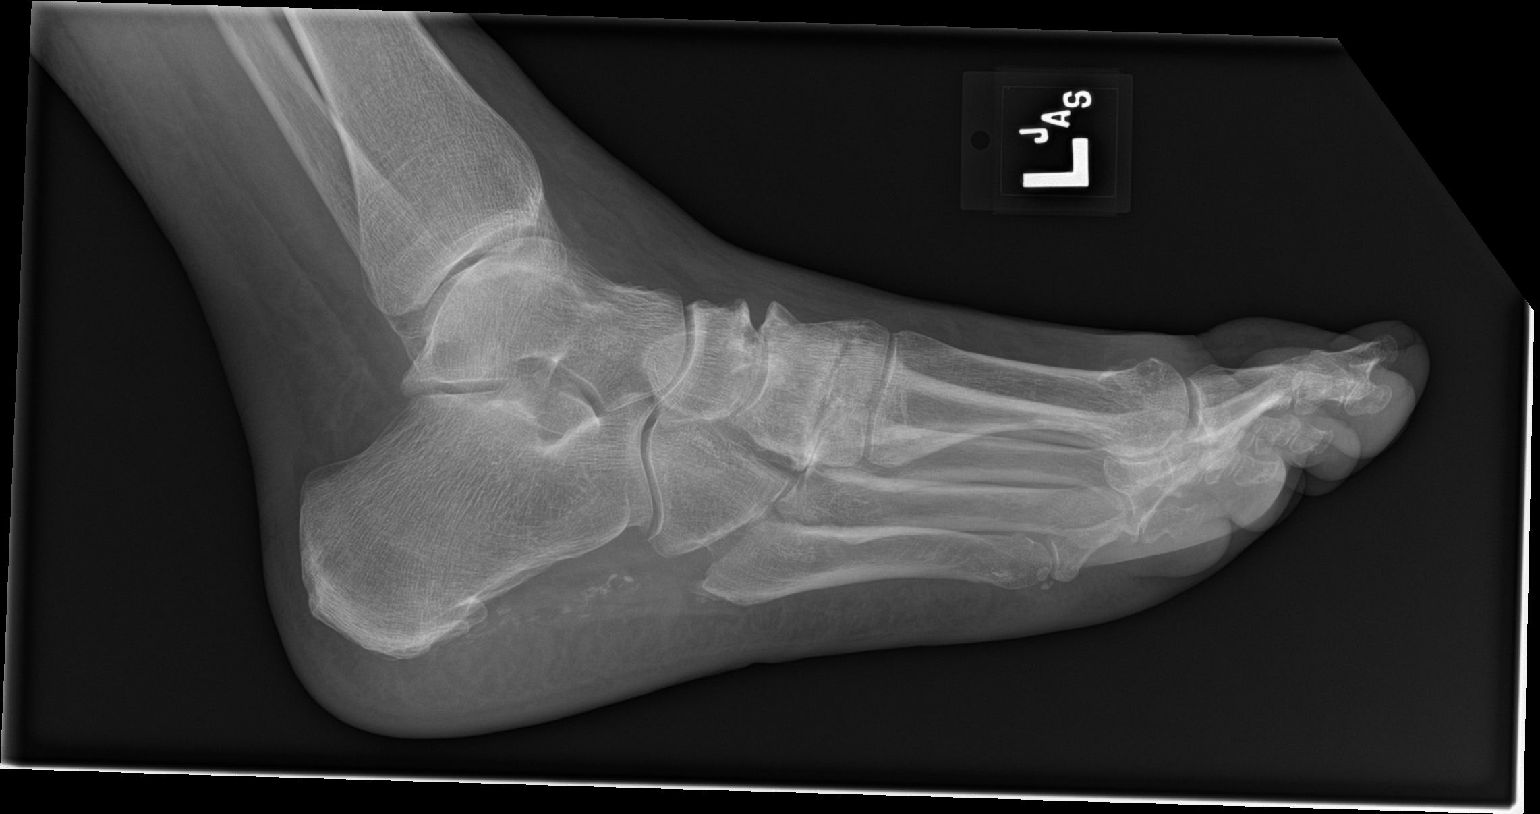

[3 of 3 positions shown; findings below may reference images not displayed]

FINDINGS: There is no acute fracture or dislocation. There is osteopenia
degenerative changes primarily at the carpometacarpal joint. There
is mild diffuse subcutaneous edema.
IMPRESSION: No acute fracture or dislocation.

## 2019-10-23 NOTE — Progress Notes (Signed)
Cardiology Office Note:    Date:  10/24/2019   ID:  Amanda Pham, DOB [birth year], MRN [id number]  PCP:  Drake Leach, MD  Cardiologist:  Shirlee More, MD    Referring MD: Drake Leach, MD    ASSESSMENT:    1. Palpitations   2. Essential hypertension   3. Hyperlipidemia, unspecified hyperlipidemia type    PLAN:    In order of problems listed above:  1. Check 7-day ZIO monitor regarding arrhythmia 2. Stable BP at target continue current treatment including ACE inhibitor thiazide diuretic check renal function potassium 3. Hyperlipidemia continue Zetia statin and PCSK9 inhibitor intolerant. 4. Stable CAD at this time I would not refer to coronary angiography and she had a normal myocardial perfusion study February 2021   Next appointment: 6 weeks   Medication Adjustments/Labs and Tests Ordered: Current medicines are reviewed at length with the patient today.  Concerns regarding medicines are outlined above.  Orders Placed This Encounter  Procedures  . TSH  . Comprehensive metabolic panel  . Lipid panel  . LONG TERM MONITOR (3-14 DAYS)  . EKG 12-Lead   No orders of the defined types were placed in this encounter.   Chief Complaint  Patient presents with  . Follow-up  . Coronary Artery Disease  . Shortness of Breath    History of Present Illness:    Amanda Pham is a 79 y.o. female with a hx of CAD, hypertension hyperlipidemia and shortness of breath with the previous extensive evaluation with no findings of heart failure, ischemia on perfusion imaging or venous thromboembolism however she did have mild apical scarring on her chest x-ray.  She was last seen 04/19/2019. Compliance with diet, lifestyle and medications: Yes  She also has rheumatoid arthritis and has been treated with Actemra and most recently started on hydroxychloroquine.  Her daughter is concerned because of her fatigue.  She has had some palpitation which is raise concern but not severe  sustained.  She has had some nonexertional chest pain brief momentary.  We talked about further evaluation she is allergic to contrast dye and I would not refer to coronary angiography.  After discussion patient and daughter will recheck full labs including thyroid lipid profile CMP and a CBC Past Medical History:  Diagnosis Date  . Arthritis   . Fibromyalgia   . Hyperlipidemia   . Hypertension   . IBS (irritable bowel syndrome)   . Knee osteonecrosis ([place])   . Osteopenia     Past Surgical History:  Procedure Laterality Date  . BILATERAL SALPINGOOPHORECTOMY Bilateral 1979   endometriosis  . CARPAL TUNNEL RELEASE Right 1989  . COLONOSCOPY W/ BIOPSIES  06/12/09   sigmoid diverticulitis  . ESOPHAGOGASTRODUODENOSCOPY ENDOSCOPY  06/12/09   gastric polyps X 25  . ESOPHAGOGASTRODUODENOSCOPY ENDOSCOPY  08/14/09   benign fundal polyps - no adenoma  . LEFT HEART CATH AND CORONARY ANGIOGRAPHY N/A 05/15/2017   Procedure: LEFT HEART CATH AND CORONARY ANGIOGRAPHY;  Surgeon: Belva Crome, MD;  Location: [facility];  Service: Cardiovascular;  Laterality: N/A;  . 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 TOTAL ABDOMINAL HYSTERECTOMY  1978    Current Medications: Current Meds  Medication Sig  . citalopram (CELEXA) 20 MG tablet Take 0.5 tablets by mouth 2 (two) times daily.  . cycloSPORINE (RESTASIS) 0.05 % ophthalmic emulsion Place 1 drop into both eyes 2 (two) times daily.  Marland Kitchen ezetimibe (ZETIA) 10 MG tablet  TAKE 1 TABLET BY MOUTH EVERY DAY  . fluticasone (FLONASE) 50 MCG/ACT nasal spray Place 1 spray into both nostrils as needed.  . hydroxychloroquine (PLAQUENIL) 200 MG tablet Take 200 mg by mouth daily.  . hyoscyamine (ANASPAZ) 0.125 MG TBDP Place 0.125 mg under the tongue every 4 (four) hours as needed.   Marland Kitchen lisinopril-hydrochlorothiazide (PRINZIDE,ZESTORETIC) 20-25 MG per tablet TAKE 1/2 TABLET TWICE A DAY  . loratadine (CLARITIN) 10 MG tablet Take 10  mg by mouth as needed.  . nitroGLYCERIN (NITROSTAT) 0.4 MG SL tablet Place 1 tablet (0.4 mg total) under the tongue as needed.  Marland Kitchen omeprazole (PRILOSEC) 40 MG capsule TAKE 1 CAPSULE (40 MG TOTAL) BY MOUTH DAILY.  Marland Kitchen Tocilizumab (ACTEMRA IV) Inject into the vein. Once every 4 weeks  . valACYclovir (VALTREX) 500 MG tablet TAKE 1 TABLET THREE TIMES A DAY AS NEEDED  . Vitamin D, Ergocalciferol, (DRISDOL) 50000 UNITS CAPS capsule Take 1 capsule (50,000 Units total) by mouth every 7 (seven) days.     Allergies:   Ivp dye [iodinated diagnostic agents], Propylene glycol, Codeine, Other, Penicillins, Sulfonamide derivatives, Adhesive [tape], and Praluent [alirocumab]   Social History   Socioeconomic History  . Marital status: Widowed    Spouse name: Not on file  . Number of children: 3  . Years of education: Not on file  . Highest education level: Not on file  Occupational History    Employer: RETIRED  Tobacco Use  . Smoking status: Never Smoker  . Smokeless tobacco: Never Used  Vaping Use  . Vaping Use: Never used  Substance and Sexual Activity  . Alcohol use: No  . Drug use: No  . Sexual activity: Not on file  Other Topics Concern  . Not on file  Social History Narrative  . Not on file   Social Determinants of Health   Financial Resource Strain:   . Difficulty of Paying Living Expenses: Not on file  Food Insecurity:   . Worried About Charity fundraiser in the Last Year: Not on file  . Ran Out of Food in the Last Year: Not on file  Transportation Needs:   . Lack of Transportation (Medical): Not on file  . Lack of Transportation (Non-Medical): Not on file  Physical Activity:   . Days of Exercise per Week: Not on file  . Minutes of Exercise per Session: Not on file  Stress:   . Feeling of Stress : Not on file  Social Connections:   . Frequency of Communication with Friends and Family: Not on file  . Frequency of Social Gatherings with Friends and Family: Not on file  .  Attends Religious Services: Not on file  . Active Member of Clubs or Organizations: Not on file  . Attends Archivist Meetings: Not on file  . Marital Status: Not on file     Family History: The patient's family history includes Breast cancer (age of onset: 82) in her sister. ROS:   Please see the history of present illness.    All other systems reviewed and are negative.  EKGs/Labs/Other Studies Reviewed:    The following studies were reviewed today:  EKG:  EKG ordered today and personally reviewed.  The ekg ordered today demonstrates sinus rhythm left axis deviation normal QT interval  Recent Labs: 03/17/2019: BUN 23; Creatinine, Ser 1.31; NT-Pro BNP 201; Potassium 4.0; Sodium 139 03/21/2019: Hemoglobin 15.5; Platelets 305  Recent Lipid Panel    Component Value Date/Time   CHOL 221 (H)  02/08/2018 1706   TRIG 218 (H) 02/08/2018 1706   HDL 64 02/08/2018 1706   CHOLHDL 3.5 02/08/2018 1706   CHOLHDL 5 04/26/2010 1041   VLDL 47.0 (H) 04/26/2010 1041   LDLCALC 113 (H) 02/08/2018 1706   LDLDIRECT 171.5 04/26/2010 1041    Physical Exam:    VS:  BP (!) 142/82   Pulse 72   Ht 5\' 5"  (1.651 m)   Wt 187 lb 1.3 oz (84.9 kg)   LMP 02/11/1976   SpO2 96%   BMI 31.13 kg/m     Wt Readings from Last 3 Encounters:  10/24/19 187 lb 1.3 oz (84.9 kg)  04/19/19 191 lb (86.6 kg)  04/06/19 190 lb (86.2 kg)     GEN:  Well nourished, well developed in no acute distress HEENT: Normal NECK: No JVD; No carotid bruits LYMPHATICS: No lymphadenopathy CARDIAC: RRR, no murmurs, rubs, gallops RESPIRATORY:  Clear to auscultation without rales, wheezing or rhonchi  ABDOMEN: Soft, non-tender, non-distended MUSCULOSKELETAL:  No edema; No deformity  SKIN: Warm and dry NEUROLOGIC:  Alert and oriented x 3 PSYCHIATRIC:  Normal affect    Signed, Shirlee More, MD  10/24/2019 4:18 PM    [facility]

## 2019-10-24 ENCOUNTER — Ambulatory Visit: Payer: Medicare PPO | Admitting: Cardiology

## 2019-10-24 ENCOUNTER — Ambulatory Visit (INDEPENDENT_AMBULATORY_CARE_PROVIDER_SITE_OTHER): Payer: Medicare PPO

## 2019-10-24 ENCOUNTER — Encounter: Payer: Self-pay | Admitting: Cardiology

## 2019-10-24 ENCOUNTER — Other Ambulatory Visit: Payer: Self-pay

## 2019-10-24 VITALS — BP 142/82 | HR 72 | Ht 65.0 in | Wt 187.1 lb

## 2019-10-24 DIAGNOSIS — E785 Hyperlipidemia, unspecified: Secondary | ICD-10-CM

## 2019-10-24 DIAGNOSIS — I10 Essential (primary) hypertension: Secondary | ICD-10-CM

## 2019-10-24 DIAGNOSIS — R002 Palpitations: Secondary | ICD-10-CM | POA: Diagnosis not present

## 2019-10-24 NOTE — Patient Instructions (Addendum)
Medication Instructions:  Your physician recommends that you continue on your current medications as directed. Please refer to the Current Medication list given to you today.  *If you need a refill on your cardiac medications before your next appointment, please call your pharmacy*   Lab Work: Your physician recommends that you return for lab work in: TODAY CMP, Lipids, TSH If you have labs (blood work) drawn today and your tests are completely normal, you will receive your results only by: Marland Kitchen MyChart Message (if you have MyChart) OR . A paper copy in the mail If you have any lab test that is abnormal or we need to change your treatment, we will call you to review the results.   Testing/Procedures: Your physician has requested that you have an echocardiogram. Echocardiography is a painless test that uses sound waves to create images of your heart. It provides your doctor with information about the size and shape of your heart and how well your heart's chambers and valves are working. This procedure takes approximately one hour. There are no restrictions for this procedure.  A zio monitor was ordered today. It will remain on for 7 days. You will then return monitor and event diary in provided box. It takes 1-2 weeks for report to be downloaded and returned to [place]. We will call you with the results. If monitor falls off or has orange flashing light, please call Zio for further instructions.      Follow-Up: At [facility], you and your health needs are our priority.  As part of our continuing mission to provide you with exceptional heart care, we have created designated Provider Care Teams.  These Care Teams include your primary Cardiologist (physician) and Advanced Practice Providers (APPs -  Physician Assistants and Nurse Practitioners) who all work together to provide you with the care you need, when you need it.  We recommend signing up for the patient portal called "MyChart".  Sign up  information is provided on this After Visit Summary.  MyChart is used to connect with patients for Virtual Visits (Telemedicine).  Patients are able to view lab/test results, encounter notes, upcoming appointments, etc.  Non-urgent messages can be sent to your provider as well.   To learn more about what you can do with MyChart, go to [email or web address].    Your next appointment:   6 week(s)  The format for your next appointment:   In Person  Provider:   Shirlee More, MD   Other Instructions Please set up a new patient visit with Dr. Earlie Counts.

## 2019-10-25 ENCOUNTER — Telehealth: Payer: Self-pay

## 2019-10-25 LAB — COMPREHENSIVE METABOLIC PANEL
ALT: 19 IU/L (ref 0–32)
AST: 23 IU/L (ref 0–40)
Albumin/Globulin Ratio: 2 (ref 1.2–2.2)
Albumin: 4.7 g/dL (ref 3.7–4.7)
Alkaline Phosphatase: 41 IU/L — ABNORMAL LOW (ref 44–121)
BUN/Creatinine Ratio: 19 (ref 12–28)
BUN: 20 mg/dL (ref 8–27)
Bilirubin Total: 0.4 mg/dL (ref 0.0–1.2)
CO2: 28 mmol/L (ref 20–29)
Calcium: 9.7 mg/dL (ref 8.7–10.3)
Chloride: 100 mmol/L (ref 96–106)
Creatinine, Ser: 1.06 mg/dL — ABNORMAL HIGH (ref 0.57–1.00)
GFR calc Af Amer: 58 mL/min/{1.73_m2} — ABNORMAL LOW (ref >59)
GFR calc non Af Amer: 50 mL/min/{1.73_m2} — ABNORMAL LOW (ref >59)
Globulin, Total: 2.3 g/dL (ref 1.5–4.5)
Glucose: 101 mg/dL — ABNORMAL HIGH (ref 65–99)
Potassium: 3.9 mmol/L (ref 3.5–5.2)
Sodium: 140 mmol/L (ref 134–144)
Total Protein: 7 g/dL (ref 6.0–8.5)

## 2019-10-25 LAB — LIPID PANEL
Chol/HDL Ratio: 4.5 ratio — ABNORMAL HIGH (ref 0.0–4.4)
Cholesterol, Total: 252 mg/dL — ABNORMAL HIGH (ref 100–199)
HDL: 56 mg/dL (ref >39)
LDL Chol Calc (NIH): 130 mg/dL — ABNORMAL HIGH (ref 0–99)
Triglycerides: 370 mg/dL — ABNORMAL HIGH (ref 0–149)
VLDL Cholesterol Cal: 66 mg/dL — ABNORMAL HIGH (ref 5–40)

## 2019-10-25 LAB — TSH: TSH: 4.48 u[IU]/mL (ref 0.450–4.500)

## 2019-10-25 MED ORDER — ICOSAPENT ETHYL 1 G PO CAPS: 2.0000 g | ORAL_CAPSULE | Freq: Two times a day (BID) | ORAL | 3 refills | Status: DC

## 2019-10-25 NOTE — Telephone Encounter (Signed)
Spoke with patient regarding results and recommendation.  Patient verbalizes understanding and is agreeable to plan of care. Advised patient to call back with any issues or concerns.  

## 2019-10-25 NOTE — Telephone Encounter (Signed)
-----   Message from Richardo Priest, MD sent at 10/25/2019  8:09 AM EDT ----- Stable results except  Lipids remain severely elevated  I think she would benefit from highly purified fish oil vascepa twice daily if she is agreeable lets start her and we can recheck lipids in 6 weeks

## 2019-11-14 ENCOUNTER — Telehealth: Payer: Self-pay

## 2019-11-14 NOTE — Telephone Encounter (Signed)
-----   Message from Richardo Priest, MD sent at 11/14/2019  3:27 PM EDT ----- Her monitor shows brief runs of extra beats but no episodes of atrial fibrillation, would like to put her on a minimum dose of beta-blocker Toprol-XL 25 mg daily which should be beneficial and keep her from having a longer episodes of rapid heart rhythm.

## 2019-11-14 NOTE — Telephone Encounter (Signed)
Left message on patients voicemail to please return our call.   

## 2019-11-15 ENCOUNTER — Telehealth: Payer: Self-pay

## 2019-11-15 MED ORDER — METOPROLOL SUCCINATE ER 25 MG PO TB24: 25.0000 mg | ORAL_TABLET | Freq: Every day | ORAL | 3 refills | Status: DC

## 2019-11-15 NOTE — Telephone Encounter (Signed)
Spoke with patient regarding results and recommendation.  Patient verbalizes understanding and is agreeable to plan of care. Advised patient to call back with any issues or concerns.  

## 2019-11-15 NOTE — Telephone Encounter (Signed)
-----   Message from Richardo Priest, MD sent at 11/14/2019  3:27 PM EDT ----- Her monitor shows brief runs of extra beats but no episodes of atrial fibrillation, would like to put her on a minimum dose of beta-blocker Toprol-XL 25 mg daily which should be beneficial and keep her from having a longer episodes of rapid heart rhythm.

## 2019-11-16 ENCOUNTER — Telehealth: Payer: Self-pay

## 2019-11-16 ENCOUNTER — Ambulatory Visit (HOSPITAL_BASED_OUTPATIENT_CLINIC_OR_DEPARTMENT_OTHER)
Admission: RE | Admit: 2019-11-16 | Discharge: 2019-11-16 | Disposition: A | Discharge status: Home / Self Care | Payer: Medicare PPO | Source: Ambulatory Visit | Attending: Cardiology | Admitting: Cardiology

## 2019-11-16 ENCOUNTER — Other Ambulatory Visit: Payer: Self-pay

## 2019-11-16 DIAGNOSIS — R002 Palpitations: Secondary | ICD-10-CM | POA: Insufficient documentation

## 2019-11-16 DIAGNOSIS — R0602 Shortness of breath: Secondary | ICD-10-CM | POA: Diagnosis not present

## 2019-11-16 DIAGNOSIS — I10 Essential (primary) hypertension: Secondary | ICD-10-CM

## 2019-11-16 DIAGNOSIS — E785 Hyperlipidemia, unspecified: Secondary | ICD-10-CM | POA: Diagnosis not present

## 2019-11-16 NOTE — Telephone Encounter (Signed)
Patient is requesting confirmation that she is ok to receive Actimra infusion on 11/28/19. She is concerned about receiving that since her results showed a premature heartbeat. Requests call to confirm and explain.

## 2019-11-16 NOTE — Telephone Encounter (Signed)
She should be fine to get her treatment.

## 2019-11-17 NOTE — Telephone Encounter (Signed)
Spoke to the patient just now and let her know Dr. Terrial Rhodes recommendation. She verbalizes understanding and thanks me for the call back.

## 2019-11-21 LAB — ECHOCARDIOGRAM COMPLETE
Area-P 1/2: 3.03 cm2
S' Lateral: 2.23 cm

## 2019-12-19 DIAGNOSIS — M1990 Unspecified osteoarthritis, unspecified site: Secondary | ICD-10-CM | POA: Insufficient documentation

## 2019-12-19 DIAGNOSIS — M879 Osteonecrosis, unspecified: Secondary | ICD-10-CM | POA: Insufficient documentation

## 2019-12-19 DIAGNOSIS — I10 Essential (primary) hypertension: Secondary | ICD-10-CM | POA: Insufficient documentation

## 2019-12-19 DIAGNOSIS — K589 Irritable bowel syndrome without diarrhea: Secondary | ICD-10-CM | POA: Insufficient documentation

## 2019-12-19 DIAGNOSIS — M8580 Other specified disorders of bone density and structure, unspecified site: Secondary | ICD-10-CM | POA: Insufficient documentation

## 2019-12-20 NOTE — Progress Notes (Signed)
Cardiology Office Note:    Date:  12/21/2019   ID:  Amanda Pham, DOB 07/18/1940, MRN [id number]  PCP:  Francesca Oman, DO  Cardiologist:  Shirlee More, MD    Referring MD: Drake Leach, MD    ASSESSMENT:    1. Coronary artery disease involving native coronary artery of native heart with angina pectoris ([place])   2. Essential hypertension   3. Hyperlipidemia, unspecified hyperlipidemia type    PLAN:    In order of problems listed above:  1. Stable CAD, continue medical therapy including beta-blocker and lipid-lowering. 2. At target continue current treatment 3. Statin and PCSK9 inhibitor intolerant continue treatment Zetia icosapent ethyl and if her lipids remain above target consider Colestid.  Check lipid profile liver function today   Next appointment: 6 months   Medication Adjustments/Labs and Tests Ordered: Current medicines are reviewed at length with the patient today.  Concerns regarding medicines are outlined above.  Orders Placed This Encounter  Procedures  . Comprehensive metabolic panel  . Lipid panel   No orders of the defined types were placed in this encounter.   History of Present Illness:    Amanda Pham is a 79 y.o. female with a hx of mild CAD hypertension hyperlipidemia with statin intolerance and shortness of breath last seen 04/19/2019.  Compliance with diet, lifestyle and medications: Yes  She is better presently not having palpitation and rarely has chest discomfort is not needed nitroglycerin.  She brings an article that her sister center focusing on the small incidence of atrial fibrillation with icosapent ethyl of uncertain significance.  I told her in her case I think she is good to continue on it and will recheck a lipid profile with her combined therapy with Zetia.  Not having palpitation or shortness of breath.  She had an echocardiogram performed 11/16/2019 showing EF of 60 to 65% normal left ventricular filling pressure normal  right heart size function and no finding of pulmonary artery hypertension.  A ZIO monitor beginning 10/24/2019 for 8 days showed rare supraventricular and ventricular ectopy. Past Medical History:  Diagnosis Date  . Acute pain of right wrist 09/16/2017  . Age-related osteoporosis without current pathological fracture 01/20/2016   reclast 2013, 2016.2017, 2021  Formatting of this note might be different from the original. reclast 2013, 2016.2017, 2021  . Allergic rhinitis due to pollen 06/09/2008   Qualifier: Diagnosis of  By: Arnoldo Morale MD, Balinda Quails   . ALLERGY UNSPECIFIED NOT ELSEWHERE CLASSIFIED 11/09/2008   Qualifier: Diagnosis of  By: Arnoldo Morale MD, Balinda Quails   . Arthritis   . 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 Bilateral low back pain without sciatica 10/05/2016  . Chest pain 05/13/2017  . Coronary artery disease involving native coronary artery of native heart 02/07/2018   Formatting of this note might be different from the original. April 2019 catheterization: LAD 40-60%                                            Obtuse marginal 20-30%  . Drug therapy 03/28/2015   Pt has tried Morrie Sheldon, Actemra stopped working, Leflunomide caused diarrhea and increased IBS symptoms, Plaquenil stopped working, Press photographer caused vaginal yeast infections, Remicade stopped working, Humira stopped working, and Sulfasalazine increased IBS symptoms.  Formatting of this note might be  different from the original. Pt has tried Morrie Sheldon, Actemra stopped working, Leflunomide caused diarrhea  . Essential hypertension 09/15/2006   Qualifier: Diagnosis of  By: Jimmye Norman, LPN, Winfield Cunas   Formatting of this note might be different from the original. Overview:  Qualifier: Diagnosis of  By: Jimmye Norman, LPN, Winfield Cunas  . Fibromyalgia   . GASTRIC POLYP 07/25/2009   Qualifier: Diagnosis of  By: Arnoldo Morale MD, Liliana Cline of this note might be different from the original.  Overview:  Qualifier: Diagnosis of  By: Arnoldo Morale MD, Balinda Quails  . Gastroesophageal reflux disease 11/13/2006   Qualifier: Diagnosis of  By: Arnoldo Morale MD, Liliana Cline of this note might be different from the original. Overview:  Qualifier: Diagnosis of  By: Arnoldo Morale MD, John E  . Herpes simplex 03/20/2017  . Hyperlipidemia   . Hypertension   . 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 ([place]) 01/26/2017  . Hypoglycemia, unspecified 05/15/2008   Qualifier: Diagnosis of  By: Arnoldo Morale MD, Balinda Quails   . IBS (irritable bowel syndrome)   . INSUFFICIENCY, ACUTE CEREBROVASCULAR NOS 10/15/2006   Qualifier: Diagnosis of  By: Arnoldo Morale MD, [place] CYSTITIS 05/02/2009   Qualifier: Diagnosis of  By: Arnoldo Morale MD, Liliana Cline of this note might be different from the original. Overview:  Qualifier: Diagnosis of  By: Arnoldo Morale MD, Balinda Quails  . Knee osteonecrosis ([place])   . Mixed connective tissue disease ([place]) 05/19/2011  . MUSCLE CRAMPS 05/15/2008   Qualifier: Diagnosis of  By: Arnoldo Morale MD, Balinda Quails   . Osteoarthritis 10/15/2006   Qualifier: Diagnosis of  By: Arnoldo Morale MD, Liliana Cline of this note might be different from the original. Overview:  Qualifier: Diagnosis of  By: Arnoldo Morale MD, Balinda Quails Osteopenia   . OSTEOPENIA 10/15/2006   Qualifier: Diagnosis of  By: Arnoldo Morale MD, John E   . Pain in joints 03/28/2015  . RECTAL FISSURE 08/31/2009   Qualifier: Diagnosis of  By: Jimmye Norman, LPN, Winfield Cunas   . Seropositive rheumatoid arthritis ([place]) 10/15/2006   That this is confirmed seronegative rheumatoid arthritis.   Qualifier: Diagnosis of  By: Arnoldo Morale MD, Liliana Cline of this note might be different from the original. Overview:  Screening for CTD negative Working diagnosis sero negative RA  Last Assessment & Plan:   Patient has polyarticular arthritis we have given her a trial of Plaquenil to see if we could alleviate some of her arthritic pa  . Sigmoid diverticulosis 01/26/2017  . Unspecified deficiency  anemia 10/30/2009   Qualifier: Diagnosis of  By: Arnoldo Morale MD, Balinda Quails   . Vertigo 08/20/2010    Past Surgical History:  Procedure Laterality Date  . BILATERAL SALPINGOOPHORECTOMY Bilateral 1979   endometriosis  . CARPAL TUNNEL RELEASE Right 1989  . COLONOSCOPY W/ BIOPSIES  06/12/09   sigmoid diverticulitis  . ESOPHAGOGASTRODUODENOSCOPY ENDOSCOPY  06/12/09   gastric polyps X 25  . ESOPHAGOGASTRODUODENOSCOPY ENDOSCOPY  08/14/09   benign fundal polyps - no adenoma  . LEFT HEART CATH AND CORONARY ANGIOGRAPHY N/A 05/15/2017   Procedure: LEFT HEART CATH AND CORONARY ANGIOGRAPHY;  Surgeon: Belva Crome, MD;  Location: [facility];  Service: Cardiovascular;  Laterality: N/A;  . 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 TOTAL ABDOMINAL HYSTERECTOMY  1978    Current Medications: Current Meds  Medication Sig  . citalopram (  CELEXA) 20 MG tablet Take 0.5 tablets by mouth 2 (two) times daily.  . cycloSPORINE (RESTASIS) 0.05 % ophthalmic emulsion Place 1 drop into both eyes 2 (two) times daily.  Marland Kitchen ezetimibe (ZETIA) 10 MG tablet TAKE 1 TABLET BY MOUTH EVERY DAY  . fluticasone (FLONASE) 50 MCG/ACT nasal spray Place 1 spray into both nostrils as needed.  . hydroxychloroquine (PLAQUENIL) 200 MG tablet Take 200 mg by mouth daily.  . hyoscyamine (ANASPAZ) 0.125 MG TBDP Place 0.125 mg under the tongue every 4 (four) hours as needed.   Marland Kitchen icosapent Ethyl (VASCEPA) 1 g capsule Take 2 capsules (2 g total) by mouth 2 (two) times daily.  Marland Kitchen lisinopril-hydrochlorothiazide (PRINZIDE,ZESTORETIC) 20-25 MG per tablet TAKE 1/2 TABLET TWICE A DAY  . loratadine (CLARITIN) 10 MG tablet Take 10 mg by mouth as needed.  . metoprolol succinate (TOPROL XL) 25 MG 24 hr tablet Take 1 tablet (25 mg total) by mouth daily.  . nitroGLYCERIN (NITROSTAT) 0.4 MG SL tablet Place 1 tablet (0.4 mg total) under the tongue as needed.  Marland Kitchen omeprazole (PRILOSEC) 40 MG capsule TAKE 1 CAPSULE (40 MG TOTAL)  BY MOUTH DAILY.  Marland Kitchen Tocilizumab (ACTEMRA IV) Inject into the vein. Once every 4 weeks  . valACYclovir (VALTREX) 500 MG tablet TAKE 1 TABLET THREE TIMES A DAY AS NEEDED     Allergies:   Ivp dye [iodinated diagnostic agents], Propylene glycol, Codeine, Other, Penicillins, Sulfonamide derivatives, Adhesive [tape], and Praluent [alirocumab]   Social History   Socioeconomic History  . Marital status: Widowed    Spouse name: Not on file  . Number of children: 3  . Years of education: Not on file  . Highest education level: Not on file  Occupational History    Employer: RETIRED  Tobacco Use  . Smoking status: Never Smoker  . Smokeless tobacco: Never Used  Vaping Use  . Vaping Use: Never used  Substance and Sexual Activity  . Alcohol use: No  . Drug use: No  . Sexual activity: Not on file  Other Topics Concern  . Not on file  Social History Narrative  . Not on file   Social Determinants of Health   Financial Resource Strain:   . Difficulty of Paying Living Expenses: Not on file  Food Insecurity:   . Worried About Charity fundraiser in the Last Year: Not on file  . Ran Out of Food in the Last Year: Not on file  Transportation Needs:   . Lack of Transportation (Medical): Not on file  . Lack of Transportation (Non-Medical): Not on file  Physical Activity:   . Days of Exercise per Week: Not on file  . Minutes of Exercise per Session: Not on file  Stress:   . Feeling of Stress : Not on file  Social Connections:   . Frequency of Communication with Friends and Family: Not on file  . Frequency of Social Gatherings with Friends and Family: Not on file  . Attends Religious Services: Not on file  . Active Member of Clubs or Organizations: Not on file  . Attends Archivist Meetings: Not on file  . Marital Status: Not on file     Family History: The patient's family history includes Breast cancer (age of onset: 1) in her sister. ROS:   Please see the history of present  illness.    All other systems reviewed and are negative.  EKGs/Labs/Other Studies Reviewed:    The following studies were reviewed today:  Recent Labs: 03/17/2019: NT-Pro BNP 201 03/21/2019: Hemoglobin 15.5; Platelets 305 10/24/2019: ALT 19; BUN 20; Creatinine, Ser 1.06; Potassium 3.9; Sodium 140; TSH 4.480  Recent Lipid Panel    Component Value Date/Time   CHOL 252 (H) 10/24/2019 1600   TRIG 370 (H) 10/24/2019 1600   HDL 56 10/24/2019 1600   CHOLHDL 4.5 (H) 10/24/2019 1600   CHOLHDL 5 04/26/2010 1041   VLDL 47.0 (H) 04/26/2010 1041   LDLCALC 130 (H) 10/24/2019 1600   LDLDIRECT 171.5 04/26/2010 1041    Physical Exam:    VS:  BP 140/68   Pulse 60   Ht 5\' 5"  (1.651 m)   Wt 188 lb (85.3 kg)   LMP 02/11/1976   SpO2 93%   BMI 31.28 kg/m     Wt Readings from Last 3 Encounters:  12/21/19 188 lb (85.3 kg)  10/24/19 187 lb 1.3 oz (84.9 kg)  04/19/19 191 lb (86.6 kg)     GEN:  Well nourished, well developed in no acute distress HEENT: Normal NECK: No JVD; No carotid bruits LYMPHATICS: No lymphadenopathy CARDIAC: RRR, no murmurs, rubs, gallops RESPIRATORY:  Clear to auscultation without rales, wheezing or rhonchi  ABDOMEN: Soft, non-tender, non-distended MUSCULOSKELETAL:  No edema; No deformity  SKIN: Warm and dry NEUROLOGIC:  Alert and oriented x 3 PSYCHIATRIC:  Normal affect    Signed, Shirlee More, MD  12/21/2019 3:31 PM     [facility]

## 2019-12-21 ENCOUNTER — Ambulatory Visit: Payer: Medicare PPO | Admitting: Cardiology

## 2019-12-21 ENCOUNTER — Other Ambulatory Visit: Payer: Self-pay

## 2019-12-21 ENCOUNTER — Encounter: Payer: Self-pay | Admitting: Cardiology

## 2019-12-21 VITALS — BP 140/68 | HR 60 | Ht 65.0 in | Wt 188.0 lb

## 2019-12-21 DIAGNOSIS — I10 Essential (primary) hypertension: Secondary | ICD-10-CM

## 2019-12-21 DIAGNOSIS — E785 Hyperlipidemia, unspecified: Secondary | ICD-10-CM

## 2019-12-21 DIAGNOSIS — I25119 Atherosclerotic heart disease of native coronary artery with unspecified angina pectoris: Secondary | ICD-10-CM | POA: Diagnosis not present

## 2019-12-21 NOTE — Patient Instructions (Signed)

## 2019-12-22 LAB — COMPREHENSIVE METABOLIC PANEL
ALT: 18 IU/L (ref 0–32)
AST: 22 IU/L (ref 0–40)
Albumin/Globulin Ratio: 2.1 (ref 1.2–2.2)
Albumin: 4.5 g/dL (ref 3.7–4.7)
Alkaline Phosphatase: 37 IU/L — ABNORMAL LOW (ref 44–121)
BUN/Creatinine Ratio: 19 (ref 12–28)
BUN: 23 mg/dL (ref 8–27)
Bilirubin Total: 0.5 mg/dL (ref 0.0–1.2)
CO2: 26 mmol/L (ref 20–29)
Calcium: 9.8 mg/dL (ref 8.7–10.3)
Chloride: 103 mmol/L (ref 96–106)
Creatinine, Ser: 1.24 mg/dL — ABNORMAL HIGH (ref 0.57–1.00)
GFR calc Af Amer: 48 mL/min/{1.73_m2} — ABNORMAL LOW (ref >59)
GFR calc non Af Amer: 42 mL/min/{1.73_m2} — ABNORMAL LOW (ref >59)
Globulin, Total: 2.1 g/dL (ref 1.5–4.5)
Glucose: 116 mg/dL — ABNORMAL HIGH (ref 65–99)
Potassium: 3.7 mmol/L (ref 3.5–5.2)
Sodium: 141 mmol/L (ref 134–144)
Total Protein: 6.6 g/dL (ref 6.0–8.5)

## 2019-12-22 LAB — LIPID PANEL
Chol/HDL Ratio: 4 ratio (ref 0.0–4.4)
Cholesterol, Total: 232 mg/dL — ABNORMAL HIGH (ref 100–199)
HDL: 58 mg/dL (ref >39)
LDL Chol Calc (NIH): 115 mg/dL — ABNORMAL HIGH (ref 0–99)
Triglycerides: 341 mg/dL — ABNORMAL HIGH (ref 0–149)
VLDL Cholesterol Cal: 59 mg/dL — ABNORMAL HIGH (ref 5–40)

## 2020-06-16 NOTE — Progress Notes (Signed)
Cardiology Office Note:    Date:  06/18/2020   ID:  Amanda, Pham 07/26/1940, MRN [id number]  PCP:  Francesca Oman, DO  Cardiologist:  Shirlee More, MD    Referring MD: Francesca Oman, DO    ASSESSMENT:    1. Coronary artery disease involving native coronary artery of native heart with angina pectoris ([place])   2. Essential hypertension   3. Hyperlipidemia, unspecified hyperlipidemia type    PLAN:    In order of problems listed above:  1. Overall she is doing well stable CAD no recent anginal discomfort I did repeat an EKG today and continue current medical therapy including beta-blocker and lipid-lowering without statin. 2. BP at target continue treatment ACE inhibitor thiazide diuretic recheck renal function 3. Continue not lipid-lowering treatment icosapent ethyl and Zetia recheck lipid profile   Next appointment: 6 months   Medication Adjustments/Labs and Tests Ordered: Current medicines are reviewed at length with the patient today.  Concerns regarding medicines are outlined above.  No orders of the defined types were placed in this encounter.  No orders of the defined types were placed in this encounter.   Chief Complaint  Patient presents with  . Follow-up  . Coronary Artery Disease    History of Present Illness:    Amanda Pham is a 80 y.o. female with a hx of CAD hypertension hyper lipidemia being intolerant of statins as well as PCSK9 inhibitor treated with Zetia and icosapent ethyl.  She was last seen 12/21/2019. Compliance with diet, lifestyle and medications: Yes  Well tolerates her current lipid-lowering treatment very rarely has angina relieved with nitroglycerin not recently no shortness of breath edema palpitation or syncope. Past Medical History:  Diagnosis Date  . Acute pain of right wrist 09/16/2017  . Age-related osteoporosis without current pathological fracture 01/20/2016   reclast 2013, 2016.2017, 2021  Formatting of this note might be  different from the original. reclast 2013, 2016.2017, 2021  . Allergic rhinitis due to pollen 06/09/2008   Qualifier: Diagnosis of  By: Arnoldo Morale MD, Balinda Quails   . ALLERGY UNSPECIFIED NOT ELSEWHERE CLASSIFIED 11/09/2008   Qualifier: Diagnosis of  By: Arnoldo Morale MD, Balinda Quails   . Arthritis   . 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 Bilateral low back pain without sciatica 10/05/2016  . Chest pain 05/13/2017  . Coronary artery disease involving native coronary artery of native heart 02/07/2018   Formatting of this note might be different from the original. April 2019 catheterization: LAD 40-60%                                            Obtuse marginal 20-30%  . Drug therapy 03/28/2015   Pt has tried Morrie Sheldon, Actemra stopped working, Leflunomide caused diarrhea and increased IBS symptoms, Plaquenil stopped working, Press photographer caused vaginal yeast infections, Remicade stopped working, Humira stopped working, and Sulfasalazine increased IBS symptoms.  Formatting of this note might be different from the original. Pt has tried Morrie Sheldon, Actemra stopped working, Leflunomide caused diarrhea  . Essential hypertension 09/15/2006   Qualifier: Diagnosis of  By: Jimmye Norman, LPN, Winfield Cunas   Formatting of this note might be different from the original. Overview:  Qualifier: Diagnosis of  By: Jimmye Norman, LPN, Winfield Cunas  . Fibromyalgia   . GASTRIC POLYP 07/25/2009  Qualifier: Diagnosis of  By: Arnoldo Morale MD, Liliana Cline of this note might be different from the original. Overview:  Qualifier: Diagnosis of  By: Arnoldo Morale MD, Balinda Quails  . Gastroesophageal reflux disease 11/13/2006   Qualifier: Diagnosis of  By: Arnoldo Morale MD, Liliana Cline of this note might be different from the original. Overview:  Qualifier: Diagnosis of  By: Arnoldo Morale MD, John E  . Herpes simplex 03/20/2017  . Hyperlipidemia   . Hypertension   . 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 ([place])  01/26/2017  . Hypoglycemia, unspecified 05/15/2008   Qualifier: Diagnosis of  By: Arnoldo Morale MD, Balinda Quails   . IBS (irritable bowel syndrome)   . INSUFFICIENCY, ACUTE CEREBROVASCULAR NOS 10/15/2006   Qualifier: Diagnosis of  By: Arnoldo Morale MD, [place] CYSTITIS 05/02/2009   Qualifier: Diagnosis of  By: Arnoldo Morale MD, Liliana Cline of this note might be different from the original. Overview:  Qualifier: Diagnosis of  By: Arnoldo Morale MD, Balinda Quails  . Knee osteonecrosis ([place])   . Mixed connective tissue disease ([place]) 05/19/2011  . MUSCLE CRAMPS 05/15/2008   Qualifier: Diagnosis of  By: Arnoldo Morale MD, Balinda Quails   . Osteoarthritis 10/15/2006   Qualifier: Diagnosis of  By: Arnoldo Morale MD, Liliana Cline of this note might be different from the original. Overview:  Qualifier: Diagnosis of  By: Arnoldo Morale MD, Balinda Quails Osteopenia   . OSTEOPENIA 10/15/2006   Qualifier: Diagnosis of  By: Arnoldo Morale MD, John E   . Pain in joints 03/28/2015  . RECTAL FISSURE 08/31/2009   Qualifier: Diagnosis of  By: Jimmye Norman, LPN, Winfield Cunas   . Seropositive rheumatoid arthritis ([place]) 10/15/2006   That this is confirmed seronegative rheumatoid arthritis.   Qualifier: Diagnosis of  By: Arnoldo Morale MD, Liliana Cline of this note might be different from the original. Overview:  Screening for CTD negative Working diagnosis sero negative RA  Last Assessment & Plan:   Patient has polyarticular arthritis we have given her a trial of Plaquenil to see if we could alleviate some of her arthritic pa  . Sigmoid diverticulosis 01/26/2017  . Unspecified deficiency anemia 10/30/2009   Qualifier: Diagnosis of  By: Arnoldo Morale MD, Balinda Quails   . Vertigo 08/20/2010    Past Surgical History:  Procedure Laterality Date  . BILATERAL SALPINGOOPHORECTOMY Bilateral 1979   endometriosis  . CARPAL TUNNEL RELEASE Right 1989  . COLONOSCOPY W/ BIOPSIES  06/12/09   sigmoid diverticulitis  . ESOPHAGOGASTRODUODENOSCOPY ENDOSCOPY  06/12/09   gastric polyps X 25  .  ESOPHAGOGASTRODUODENOSCOPY ENDOSCOPY  08/14/09   benign fundal polyps - no adenoma  . LEFT HEART CATH AND CORONARY ANGIOGRAPHY N/A 05/15/2017   Procedure: LEFT HEART CATH AND CORONARY ANGIOGRAPHY;  Surgeon: Belva Crome, MD;  Location: [facility];  Service: Cardiovascular;  Laterality: N/A;  . 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 TOTAL ABDOMINAL HYSTERECTOMY  1978    Current Medications: Current Meds  Medication Sig  . citalopram (CELEXA) 20 MG tablet Take 0.5 tablets by mouth 2 (two) times daily.  . cycloSPORINE (RESTASIS) 0.05 % ophthalmic emulsion Place 1 drop into both eyes 2 (two) times daily.  Marland Kitchen ezetimibe (ZETIA) 10 MG tablet TAKE 1 TABLET BY MOUTH EVERY DAY  . fluticasone (FLONASE) 50 MCG/ACT nasal spray Place 1 spray into both nostrils as needed.  . hydroxychloroquine (  PLAQUENIL) 200 MG tablet Take 200 mg by mouth daily.  . hyoscyamine (ANASPAZ) 0.125 MG TBDP Place 0.125 mg under the tongue every 4 (four) hours as needed.  Marland Kitchen icosapent Ethyl (VASCEPA) 1 g capsule Take 2 capsules (2 g total) by mouth 2 (two) times daily.  Marland Kitchen lisinopril-hydrochlorothiazide (PRINZIDE,ZESTORETIC) 20-25 MG per tablet TAKE 1/2 TABLET TWICE A DAY  . loratadine (CLARITIN) 10 MG tablet Take 10 mg by mouth as needed.  . metoprolol succinate (TOPROL XL) 25 MG 24 hr tablet Take 1 tablet (25 mg total) by mouth daily.  . nitroGLYCERIN (NITROSTAT) 0.4 MG SL tablet Place 1 tablet (0.4 mg total) under the tongue as needed.  Marland Kitchen omeprazole (PRILOSEC) 40 MG capsule TAKE 1 CAPSULE (40 MG TOTAL) BY MOUTH DAILY.  Marland Kitchen Tocilizumab (ACTEMRA IV) Inject into the vein. Once every 4 weeks  . valACYclovir (VALTREX) 500 MG tablet TAKE 1 TABLET THREE TIMES A DAY AS NEEDED  . Vitamin D, Ergocalciferol, (DRISDOL) 50000 UNITS CAPS capsule Take 1 capsule (50,000 Units total) by mouth every 7 (seven) days.     Allergies:   Ivp dye [iodinated diagnostic agents], Propylene glycol, Codeine,  Other, Penicillins, Sulfonamide derivatives, Adhesive [tape], and Praluent [alirocumab]   Social History   Socioeconomic History  . Marital status: Widowed    Spouse name: Not on file  . Number of children: 3  . Years of education: Not on file  . Highest education level: Not on file  Occupational History    Employer: RETIRED  Tobacco Use  . Smoking status: Never Smoker  . Smokeless tobacco: Never Used  Vaping Use  . Vaping Use: Never used  Substance and Sexual Activity  . Alcohol use: No  . Drug use: No  . Sexual activity: Not on file  Other Topics Concern  . Not on file  Social History Narrative  . Not on file   Social Determinants of Health   Financial Resource Strain: Not on file  Food Insecurity: Not on file  Transportation Needs: Not on file  Physical Activity: Not on file  Stress: Not on file  Social Connections: Not on file     Family History: The patient's family history includes Breast cancer (age of onset: 30) in her sister. ROS:   Please see the history of present illness.    All other systems reviewed and are negative.  EKGs/Labs/Other Studies Reviewed:    The following studies were reviewed today:   Recent Labs: 10/24/2019: TSH 4.480 12/21/2019: ALT 18; BUN 23; Creatinine, Ser 1.24; Potassium 3.7; Sodium 141  Recent Lipid Panel    Component Value Date/Time   CHOL 232 (H) 12/21/2019 1519   TRIG 341 (H) 12/21/2019 1519   HDL 58 12/21/2019 1519   CHOLHDL 4.0 12/21/2019 1519   CHOLHDL 5 04/26/2010 1041   VLDL 47.0 (H) 04/26/2010 1041   LDLCALC 115 (H) 12/21/2019 1519   LDLDIRECT 171.5 04/26/2010 1041    Physical Exam:    VS:  BP 134/70 (BP Location: Right Arm, Patient Position: Sitting, Cuff Size: Normal)   Pulse 67   Ht 5\' 5"  (1.651 m)   Wt 189 lb (85.7 kg)   LMP 02/11/1976   SpO2 96%   BMI 31.45 kg/m     Wt Readings from Last 3 Encounters:  06/18/20 189 lb (85.7 kg)  12/21/19 188 lb (85.3 kg)  10/24/19 187 lb 1.3 oz (84.9 kg)      GEN:  Well nourished, well developed in no acute distress HEENT: Normal  NECK: No JVD; No carotid bruits LYMPHATICS: No lymphadenopathy CARDIAC: RRR, no murmurs, rubs, gallops RESPIRATORY:  Clear to auscultation without rales, wheezing or rhonchi  ABDOMEN: Soft, non-tender, non-distended MUSCULOSKELETAL:  No edema; No deformity  SKIN: Warm and dry NEUROLOGIC:  Alert and oriented x 3 PSYCHIATRIC:  Normal affect    Signed, Shirlee More, MD  06/18/2020 4:33 PM    [facility]

## 2020-06-18 ENCOUNTER — Other Ambulatory Visit: Payer: Self-pay

## 2020-06-18 ENCOUNTER — Encounter: Payer: Self-pay | Admitting: Cardiology

## 2020-06-18 ENCOUNTER — Ambulatory Visit: Payer: Medicare PPO | Admitting: Cardiology

## 2020-06-18 VITALS — BP 134/70 | HR 67 | Ht 65.0 in | Wt 189.0 lb

## 2020-06-18 DIAGNOSIS — I25119 Atherosclerotic heart disease of native coronary artery with unspecified angina pectoris: Secondary | ICD-10-CM | POA: Diagnosis not present

## 2020-06-18 DIAGNOSIS — E785 Hyperlipidemia, unspecified: Secondary | ICD-10-CM | POA: Diagnosis not present

## 2020-06-18 DIAGNOSIS — I10 Essential (primary) hypertension: Secondary | ICD-10-CM | POA: Diagnosis not present

## 2020-06-18 NOTE — Patient Instructions (Signed)

## 2020-06-19 ENCOUNTER — Telehealth: Payer: Self-pay

## 2020-06-19 LAB — COMPREHENSIVE METABOLIC PANEL
ALT: 18 IU/L (ref 0–32)
AST: 23 IU/L (ref 0–40)
Albumin/Globulin Ratio: 1.9 (ref 1.2–2.2)
Albumin: 4.5 g/dL (ref 3.7–4.7)
Alkaline Phosphatase: 43 IU/L — ABNORMAL LOW (ref 44–121)
BUN/Creatinine Ratio: 16 (ref 12–28)
BUN: 21 mg/dL (ref 8–27)
Bilirubin Total: 0.4 mg/dL (ref 0.0–1.2)
CO2: 23 mmol/L (ref 20–29)
Calcium: 9.2 mg/dL (ref 8.7–10.3)
Chloride: 105 mmol/L (ref 96–106)
Creatinine, Ser: 1.31 mg/dL — ABNORMAL HIGH (ref 0.57–1.00)
Globulin, Total: 2.4 g/dL (ref 1.5–4.5)
Glucose: 136 mg/dL — ABNORMAL HIGH (ref 65–99)
Potassium: 4 mmol/L (ref 3.5–5.2)
Sodium: 142 mmol/L (ref 134–144)
Total Protein: 6.9 g/dL (ref 6.0–8.5)
eGFR: 41 mL/min/{1.73_m2} — ABNORMAL LOW (ref >59)

## 2020-06-19 LAB — LIPID PANEL
Chol/HDL Ratio: 4.2 ratio (ref 0.0–4.4)
Cholesterol, Total: 231 mg/dL — ABNORMAL HIGH (ref 100–199)
HDL: 55 mg/dL (ref >39)
LDL Chol Calc (NIH): 134 mg/dL — ABNORMAL HIGH (ref 0–99)
Triglycerides: 234 mg/dL — ABNORMAL HIGH (ref 0–149)
VLDL Cholesterol Cal: 42 mg/dL — ABNORMAL HIGH (ref 5–40)

## 2020-06-19 NOTE — Telephone Encounter (Signed)
Patient notified of results.

## 2020-06-19 NOTE — Telephone Encounter (Signed)
-----   Message from Richardo Priest, MD sent at 06/19/2020  7:28 AM EDT ----- Stable, good results no change in treatment

## 2020-09-27 ENCOUNTER — Emergency Department (HOSPITAL_BASED_OUTPATIENT_CLINIC_OR_DEPARTMENT_OTHER)
Admission: EM | Admit: 2020-09-27 | Discharge: 2020-09-27 | Disposition: A | Discharge status: Home / Self Care | Payer: Medicare PPO | Attending: Emergency Medicine | Admitting: Emergency Medicine

## 2020-09-27 ENCOUNTER — Emergency Department (HOSPITAL_BASED_OUTPATIENT_CLINIC_OR_DEPARTMENT_OTHER): Payer: Medicare PPO

## 2020-09-27 ENCOUNTER — Encounter (HOSPITAL_BASED_OUTPATIENT_CLINIC_OR_DEPARTMENT_OTHER): Payer: Self-pay | Admitting: *Deleted

## 2020-09-27 ENCOUNTER — Other Ambulatory Visit: Payer: Self-pay

## 2020-09-27 DIAGNOSIS — Z96652 Presence of left artificial knee joint: Secondary | ICD-10-CM | POA: Insufficient documentation

## 2020-09-27 DIAGNOSIS — R519 Headache, unspecified: Secondary | ICD-10-CM | POA: Insufficient documentation

## 2020-09-27 DIAGNOSIS — M542 Cervicalgia: Secondary | ICD-10-CM | POA: Diagnosis not present

## 2020-09-27 DIAGNOSIS — N183 Chronic kidney disease, stage 3 unspecified: Secondary | ICD-10-CM | POA: Diagnosis not present

## 2020-09-27 DIAGNOSIS — Z79899 Other long term (current) drug therapy: Secondary | ICD-10-CM | POA: Diagnosis not present

## 2020-09-27 DIAGNOSIS — Y92410 Unspecified street and highway as the place of occurrence of the external cause: Secondary | ICD-10-CM | POA: Diagnosis not present

## 2020-09-27 DIAGNOSIS — I2510 Atherosclerotic heart disease of native coronary artery without angina pectoris: Secondary | ICD-10-CM | POA: Insufficient documentation

## 2020-09-27 DIAGNOSIS — I129 Hypertensive chronic kidney disease with stage 1 through stage 4 chronic kidney disease, or unspecified chronic kidney disease: Secondary | ICD-10-CM | POA: Diagnosis not present

## 2020-09-27 MED ORDER — ONDANSETRON HCL 4 MG/2ML IJ SOLN: 4.0000 mg | Freq: Once | INTRAMUSCULAR | Status: DC

## 2020-09-27 MED ORDER — ONDANSETRON 4 MG PO TBDP
4.0000 mg | ORAL_TABLET | Freq: Once | ORAL | Status: AC
  Administered 2020-09-27: 4 mg via ORAL
  Filled 2020-09-27: qty 1

## 2020-09-27 MED ORDER — ACETAMINOPHEN 325 MG PO TABS
650.0000 mg | ORAL_TABLET | Freq: Once | ORAL | Status: AC
  Administered 2020-09-27: 650 mg via ORAL
  Filled 2020-09-27: qty 2

## 2020-09-27 MED ORDER — ONDANSETRON HCL 4 MG PO TABS: 4.0000 mg | ORAL_TABLET | Freq: Four times a day (QID) | ORAL | 0 refills | Status: DC

## 2020-09-27 MED ORDER — CYCLOBENZAPRINE HCL 5 MG PO TABS: 5.0000 mg | ORAL_TABLET | Freq: Two times a day (BID) | ORAL | 0 refills | Status: AC | PRN
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
## 2020-09-27 NOTE — Discharge Instructions (Addendum)
Exam and imaging were all reassuring.  I suspect you are suffering from a minor concussion.  I recommend over-the-counter pain medications.  I recommend brain rest, i.e. please avoid screen time, intense problem solving, reading and reintroduce these as tolerated.  I have also given you a prescription for a muscle relaxer this can make you drowsy do not consume alcohol or operate heavy machinery when taking this medication.   Please follow-up with sports medicine for muscle pain  Please follow-up with concussion clinic for concussion symptoms.  Please come back to the emergency department if you have any worsening or severe headache, change in vision, numbness or tingling in your arms or legs, starts to slur your words, or her condition worsens disease or symptoms or require further evaluation.  Your grandson can contact me at [email or web address] . Com if he wants to talk about being a PA
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
## 2020-10-02 ENCOUNTER — Ambulatory Visit: Payer: Medicare PPO | Admitting: Family Medicine

## 2020-10-02 ENCOUNTER — Other Ambulatory Visit: Payer: Self-pay

## 2020-10-02 ENCOUNTER — Encounter: Payer: Self-pay | Admitting: Family Medicine

## 2020-10-02 VITALS — Ht 65.0 in | Wt 188.0 lb

## 2020-10-02 DIAGNOSIS — S060X0A Concussion without loss of consciousness, initial encounter: Secondary | ICD-10-CM
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
## 2020-10-02 NOTE — Patient Instructions (Signed)
Nice to meet you Please try light activity  Please try getting back to your normal day  Please try physical therapy   Please send me a message in [place] with any questions or updates.  Please see me back in 2 weeks.   --Dr. Raeford Razor

## 2020-10-16 ENCOUNTER — Ambulatory Visit: Payer: Medicare PPO | Admitting: Family Medicine

## 2020-10-16 NOTE — Progress Notes (Deleted)
Amanda Pham - 80 y.o. female MRN [id number]  Date of birth: [birth year]  SUBJECTIVE:  Including CC & ROS.  No chief complaint on file.   Amanda Pham is a 80 y.o. female that is  ***.  ***   Review of Systems See HPI   HISTORY: Past Medical, Surgical, Social, and Family History Reviewed & Updated per EMR.   Pertinent Historical Findings include:  Past Medical History:  Diagnosis Date   Acute pain of right wrist 09/16/2017   Age-related osteoporosis without current pathological fracture 01/20/2016   reclast 2013, 2016.2017, 2021  Formatting of this note might be different from the original. 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 April 2019 catheterization: LAD 40-60%                                            Obtuse marginal 20-30%   Drug therapy 03/28/2015   Pt has tried Cardell Peach stopped working, Leflunomide caused diarrhea and increased IBS symptoms, Plaquenil stopped working, Press photographer caused vaginal yeast infections, Remicade stopped working, Humira stopped working, and Sulfasalazine increased IBS symptoms.  Formatting of this note might be different from the original. Pt has tried Morrie Sheldon, Actemra stopped working, Leflunomide caused diarrhea   Essential hypertension 09/15/2006   Qualifier: Diagnosis of  By: Jimmye Norman, LPN, Winfield Cunas   Formatting of this note might be different from the original. Overview:  Qualifier: Diagnosis of  By:  Jimmye Norman, LPN, Winfield Cunas   Fibromyalgia    GASTRIC POLYP 07/25/2009   Qualifier: Diagnosis of  By: Arnoldo Morale MD, Liliana Cline of this note might be different from the original. Overview:  Qualifier: Diagnosis of  By: Arnoldo Morale MD, John E   Gastroesophageal reflux disease 11/13/2006   Qualifier: Diagnosis of  By: Arnoldo Morale MD, Liliana Cline of this note might be different from the original. Overview:  Qualifier: Diagnosis of  By: Arnoldo Morale MD, John E   Herpes simplex 03/20/2017   Hyperlipidemia    Hypertension    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 ([place]) 01/26/2017   Hypoglycemia, unspecified 05/15/2008   Qualifier: Diagnosis of  By: Arnoldo Morale MD, John E    IBS (irritable bowel syndrome)    INSUFFICIENCY, ACUTE CEREBROVASCULAR NOS 10/15/2006   Qualifier: Diagnosis of  By: Arnoldo Morale MD, Balinda Quails    INTERSTITIAL CYSTITIS 05/02/2009   Qualifier: Diagnosis of  By: Arnoldo Morale MD, Liliana Cline of this note might be different from the original. Overview:  Qualifier: Diagnosis of  By: Arnoldo Morale MD, John E   Knee osteonecrosis [facility])    Mixed connective tissue disease ([place]) 05/19/2011   MUSCLE CRAMPS 05/15/2008   Qualifier: Diagnosis of  By: Arnoldo Morale MD, Balinda Quails    Osteoarthritis 10/15/2006   Qualifier: Diagnosis of  By: Arnoldo Morale MD, Liliana Cline of this note might be different from the original. Overview:  Qualifier: Diagnosis of  By: Arnoldo Morale MD, John E   Osteopenia    OSTEOPENIA 10/15/2006   Qualifier: Diagnosis of  By: Arnoldo Morale MD, John E    Pain in joints 03/28/2015   RECTAL FISSURE 08/31/2009   Qualifier: Diagnosis of  By: Jimmye Norman, LPN, Winfield Cunas    Seropositive rheumatoid arthritis ([place]) 10/15/2006   That this is confirmed seronegative rheumatoid arthritis.   Qualifier: Diagnosis of  By: Arnoldo Morale MD, Liliana Cline of this note might be different from the original. Overview:  Screening for CTD negative Working diagnosis sero negative RA  Last Assessment & Plan:   Patient has polyarticular  arthritis we have given her a trial of Plaquenil to see if we could alleviate some of her arthritic pa   Sigmoid diverticulosis 01/26/2017   Unspecified deficiency anemia 10/30/2009   Qualifier: Diagnosis of  By: Arnoldo Morale MD, Balinda Quails    Vertigo 08/20/2010    Past Surgical History:  Procedure Laterality Date   BILATERAL SALPINGOOPHORECTOMY Bilateral 1979   endometriosis   CARPAL TUNNEL RELEASE Right 1989   COLONOSCOPY W/ BIOPSIES  06/12/09   sigmoid diverticulitis   ESOPHAGOGASTRODUODENOSCOPY ENDOSCOPY  06/12/09   gastric polyps X 25   ESOPHAGOGASTRODUODENOSCOPY ENDOSCOPY  08/14/09   benign fundal polyps - no adenoma   LEFT HEART CATH AND CORONARY ANGIOGRAPHY N/A 05/15/2017   Procedure: LEFT HEART CATH AND CORONARY ANGIOGRAPHY;  Surgeon: Belva Crome, MD;  Location: [place] [facility];  Service: Cardiovascular;  Laterality: N/A;   REPLACEMENT TOTAL KNEE Left 5/08   [facility] - had comlications of seizure and CVA   TOTAL ABDOMINAL HYSTERECTOMY  1978    Family History  Problem Relation Age of Onset   Breast cancer Sister 27       DCIS, double mastectomy, neg chemo, neg nodes    Social History   Socioeconomic History   Marital status: Widowed    Spouse name: Not on file   Number of children: 3   Years of education: Not on file   Highest education level: Not on file  Occupational History    Employer: RETIRED  Tobacco Use   Smoking status: Never   Smokeless tobacco: Never  Vaping Use   Vaping Use: Never used  Substance and Sexual Activity   Alcohol use: No   Drug use: No   Sexual activity: Not on file  Other Topics Concern   Not on file  Social History Narrative   Not on file   Social Determinants of Health   Financial Resource Strain: Not on file  Food Insecurity: Not on file  Transportation Needs: Not on file  Physical Activity: Not on file  Stress: Not on file  Social Connections: Not on file  Intimate Partner Violence: Not on file     PHYSICAL EXAM:  VS: LMP  02/11/1976  Physical Exam Gen: NAD, alert, cooperative with exam, well-appearing MSK:  ***      ASSESSMENT & PLAN:   No problem-specific Assessment & Plan notes found for this encounter.

## 2020-11-24 ENCOUNTER — Other Ambulatory Visit: Payer: Self-pay | Admitting: Cardiology

## 2021-01-25 ENCOUNTER — Other Ambulatory Visit: Payer: Self-pay

## 2021-01-25 ENCOUNTER — Encounter: Payer: Self-pay | Admitting: Cardiology

## 2021-01-25 ENCOUNTER — Ambulatory Visit: Payer: Medicare PPO | Admitting: Cardiology

## 2021-01-25 VITALS — BP 146/80 | HR 58 | Wt 180.0 lb

## 2021-01-25 DIAGNOSIS — E782 Mixed hyperlipidemia: Secondary | ICD-10-CM

## 2021-01-25 DIAGNOSIS — I25119 Atherosclerotic heart disease of native coronary artery with unspecified angina pectoris: Secondary | ICD-10-CM | POA: Diagnosis not present

## 2021-01-25 DIAGNOSIS — I10 Essential (primary) hypertension: Secondary | ICD-10-CM | POA: Diagnosis not present

## 2021-01-25 MED ORDER — NITROGLYCERIN 0.4 MG SL SUBL: 0.4000 mg | SUBLINGUAL_TABLET | SUBLINGUAL | 3 refills | Status: AC | PRN

## 2021-01-25 NOTE — Patient Instructions (Signed)

## 2021-01-25 NOTE — Progress Notes (Signed)
Cardiology Office Note:    Date:  01/25/2021   ID:  Amanda Pham, Amanda Pham 14-Oct-1940, MRN [id number]  PCP:  Francesca Oman, DO  Cardiologist:  Shirlee More, MD    Referring MD: Francesca Oman, DO    ASSESSMENT:    1. Coronary artery disease involving native coronary artery of native heart with angina pectoris ([place])   2. Essential hypertension   3. Mixed hyperlipidemia    PLAN:    In order of problems listed above:  Stable CAD having no angina continue current medical therapy and look to optimize lipid-lowering referral to lipid clinic for consideration Leqvio therapy.  Not on aspirin with GI intolerance Stable BP at target continue treatment beta-blocker lisinopril hydrochlorothiazide Refer to lipid clinic for now continue Zetia and icosapent ethyl   Next appointment: 1 year   Medication Adjustments/Labs and Tests Ordered: Current medicines are reviewed at length with the patient today.  Concerns regarding medicines are outlined above.  Orders Placed This Encounter  Procedures   AMB Referral to [facility]   EKG 12-Lead   Meds ordered this encounter  Medications   nitroGLYCERIN (NITROSTAT) 0.4 MG SL tablet    Sig: Place 1 tablet (0.4 mg total) under the tongue as needed for chest pain.    Dispense:  30 tablet    Refill:  3    Chief complaint, follow-up for CAD, I turned 50 next week and I have concerns with my aging and living independently History of Present Illness:    Amanda Pham is a 80 y.o. female with a hx of CAD hypertension hyperlipidemia statin intolerant as well as intolerant of PCSK9 contributor and treated with Zetia and icosapent ethyl last seen 06/18/2020 with stable angina.  Left heart catheterization April 2019 showed mild LAD 40 to 60% and marginal 20 to 30% stenosis.  Compliance with diet, lifestyle and medications: Yes  She is intolerant of both PCSK9 inhibitors and statins and despite treatment Zetia and icosapent ethyl her  lipids remain above target. I advised lipid clinic evaluation and consideration Leqvio and she agrees and is interested. Remains active no exertional chest pain edema shortness of breath palpitation or syncope. At times she has nonanginal chest pain but has not needed to take nitroglycerin  Recent labs 01/22/2021: Sodium 140 potassium 3.5 creatinine 1.21 GFR 46 cc Cholesterol 246 triglycerides 169 LDL 152 HDL 61 Hemoglobin 13.2 Past Medical History:  Diagnosis Date   Acute pain of right wrist 09/16/2017   Age-related osteoporosis without current pathological fracture 01/20/2016   reclast 2013, 2016.2017, 2021  Formatting of this note might be different from the original. 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 April 2019 catheterization: LAD 40-60%  Obtuse marginal 20-30%   Drug therapy 03/28/2015   Pt has tried Morrie Sheldon, Actemra stopped working, Leflunomide caused diarrhea and increased IBS symptoms, Plaquenil stopped working, Press photographer caused vaginal yeast infections, Remicade stopped working, Humira stopped working, and Sulfasalazine increased IBS symptoms.  Formatting of this note might be different from the original. Pt has tried Morrie Sheldon, Actemra stopped working, Leflunomide caused diarrhea   Essential hypertension 09/15/2006   Qualifier: Diagnosis of  By: Jimmye Norman, LPN, Winfield Cunas   Formatting of this note might be  different from the original. Overview:  Qualifier: Diagnosis of  By: Jimmye Norman, LPN, Winfield Cunas   Fibromyalgia    GASTRIC POLYP 07/25/2009   Qualifier: Diagnosis of  By: Arnoldo Morale MD, Liliana Cline of this note might be different from the original. Overview:  Qualifier: Diagnosis of  By: Arnoldo Morale MD, John E   Gastroesophageal reflux disease 11/13/2006   Qualifier: Diagnosis of  By: Arnoldo Morale MD, Liliana Cline of this note might be different from the original. 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 Overview:  Qualifier: Diagnosis of  By: Arnoldo Morale MD, John E   Knee osteonecrosis [facility])    Mixed connective tissue disease ([place]) 05/19/2011   MUSCLE CRAMPS 05/15/2008   Qualifier: Diagnosis of  By: Arnoldo Morale MD, Balinda Quails    Osteoarthritis 10/15/2006   Qualifier: Diagnosis of  By: Arnoldo Morale MD, Liliana Cline of this note might be different from the original. Overview:  Qualifier: Diagnosis of  By: Arnoldo Morale MD, John E   Osteopenia    OSTEOPENIA 10/15/2006   Qualifier: Diagnosis of  By: Arnoldo Morale MD, John E    Pain in joints 03/28/2015   RECTAL FISSURE 08/31/2009   Qualifier: Diagnosis of  By: Jimmye Norman, LPN, Winfield Cunas    Seropositive rheumatoid arthritis ([place]) 10/15/2006   That this is confirmed seronegative rheumatoid arthritis.   Qualifier: Diagnosis of  By: Arnoldo Morale MD, Liliana Cline of this note might be different from the original. Overview:  Screening for CTD negative Working diagnosis sero  negative RA  Last Assessment & Plan:   Patient has polyarticular arthritis we have given her a trial of Plaquenil to see if we could alleviate some of her arthritic pa   Sigmoid diverticulosis 01/26/2017   Unspecified deficiency anemia 10/30/2009   Qualifier: Diagnosis of  By: Arnoldo Morale MD, Balinda Quails    Vertigo 08/20/2010    Past Surgical History:  Procedure Laterality Date   BILATERAL SALPINGOOPHORECTOMY Bilateral 1979   endometriosis   CARPAL TUNNEL RELEASE Right 1989   COLONOSCOPY W/ BIOPSIES  06/12/09   sigmoid diverticulitis   ESOPHAGOGASTRODUODENOSCOPY ENDOSCOPY  06/12/09   gastric polyps X 25   ESOPHAGOGASTRODUODENOSCOPY ENDOSCOPY  08/14/09   benign fundal polyps - no adenoma   LEFT HEART CATH AND CORONARY ANGIOGRAPHY N/A 05/15/2017   Procedure: LEFT HEART CATH AND CORONARY ANGIOGRAPHY;  Surgeon: Belva Crome,  MD;  Location: [facility];  Service: Cardiovascular;  Laterality: N/A;   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   cycloSPORINE (RESTASIS) 0.05 % ophthalmic emulsion Place 1 drop into both eyes 2 (two) times daily.   ezetimibe (ZETIA) 10 MG tablet TAKE 1 TABLET BY MOUTH EVERY DAY   hydroxychloroquine (PLAQUENIL) 200 MG tablet Take 200 mg by mouth daily.   hyoscyamine (ANASPAZ) 0.125 MG TBDP Place 0.125 mg under the tongue every 4 (four) hours as needed.   icosapent Ethyl (VASCEPA) 1 g capsule Take 2 capsules (2 g total) by mouth 2 (two) times daily.   lisinopril-hydrochlorothiazide (PRINZIDE,ZESTORETIC) 20-25 MG per tablet TAKE 1/2 TABLET TWICE A DAY   metoprolol succinate (TOPROL-XL) 25 MG 24 hr tablet TAKE 1 TABLET BY MOUTH EVERY DAY   omeprazole (PRILOSEC) 40 MG capsule TAKE 1 CAPSULE (40 MG TOTAL) BY MOUTH DAILY.   Tocilizumab (ACTEMRA IV) Inject into the vein. Once every 4 weeks    valACYclovir (VALTREX) 500 MG tablet TAKE 1 TABLET THREE TIMES A DAY AS NEEDED (Patient taking differently: TAKE 1 TABLET THREE TIMES A DAY AS NEEDED for cold sore)   Vitamin D, Ergocalciferol, (DRISDOL) 50000 UNITS CAPS capsule Take 1 capsule (50,000 Units total) by mouth every 7 (seven) days.   [DISCONTINUED] nitroGLYCERIN (NITROSTAT) 0.4 MG SL tablet Place 1 tablet (0.4 mg total) under the tongue as needed. (Patient taking differently: Place 0.4 mg under the tongue as needed for chest pain.)     Allergies:   Iodine-131, Ivp dye [iodinated diagnostic agents], Penicillins, Propylene glycol, Shellfish-derived products, Codeine, Other, Statins, Sulfonamide derivatives, Adhesive [tape], Cephalexin, and Praluent [alirocumab]   Social History   Socioeconomic History   Marital status: Widowed    Spouse name: Not on file   Number of children: 3   Years of education: Not on file   Highest education level: Not on file  Occupational History    Employer: RETIRED  Tobacco Use   Smoking status: Never    Passive exposure: Never   Smokeless tobacco: Never  Vaping Use   Vaping Use: Never used  Substance and Sexual Activity   Alcohol use: No   Drug use: No   Sexual activity: Not on file  Other Topics Concern   Not on file  Social History Narrative   Not on file   Social Determinants of Health   Financial Resource Strain: Not on file  Food Insecurity: Not on file  Transportation Needs: Not on file  Physical Activity: Not on file  Stress: Not on file  Social Connections: Not on file     Family History: The patient's family history includes Breast cancer (age of onset: 71) in her sister. ROS:   Please see the history of present illness.    All other systems reviewed and are negative.  EKGs/Labs/Other Studies Reviewed:    The following studies were reviewed today:  EKG:  EKG ordered today and personally reviewed.  The ekg ordered today demonstrates sinus rhythm normal EKG  Recent  Labs: 06/18/2020: ALT 18; BUN 21; Creatinine, Ser 1.31; Potassium 4.0; Sodium 142  Recent Lipid Panel    Component Value Date/Time   CHOL 231 (H) 06/18/2020 1648   TRIG 234 (H) 06/18/2020 1648   HDL 55 06/18/2020  1648   CHOLHDL 4.2 06/18/2020 1648   CHOLHDL 5 04/26/2010 1041   VLDL 47.0 (H) 04/26/2010 1041   LDLCALC 134 (H) 06/18/2020 1648   LDLDIRECT 171.5 04/26/2010 1041    Physical Exam:    VS:  BP (!) 146/80    Pulse (!) 58    Ht (P) 5\' 5"  (1.651 m)    Wt 180 lb (81.6 kg)    LMP 02/11/1976    SpO2 97%    BMI (P) 29.95 kg/m     Wt Readings from Last 3 Encounters:  01/25/21 180 lb (81.6 kg)  10/02/20 188 lb (85.3 kg)  09/27/20 188 lb 15 oz (85.7 kg)     GEN:  Well nourished, well developed in no acute distress HEENT: Normal NECK: No JVD; No carotid bruits LYMPHATICS: No lymphadenopathy CARDIAC: RRR, no murmurs, rubs, gallops RESPIRATORY:  Clear to auscultation without rales, wheezing or rhonchi  ABDOMEN: Soft, non-tender, non-distended MUSCULOSKELETAL:  No edema; No deformity  SKIN: Warm and dry NEUROLOGIC:  Alert and oriented x 3 PSYCHIATRIC:  Normal affect    Signed, Shirlee More, MD  01/25/2021 12:38 PM    [facility]

## 2021-02-08 ENCOUNTER — Ambulatory Visit: Payer: Medicare PPO

## 2021-02-10 ENCOUNTER — Other Ambulatory Visit: Payer: Self-pay | Admitting: Cardiology

## 2021-03-06 ENCOUNTER — Telehealth: Payer: Self-pay | Admitting: Cardiology

## 2021-03-06 NOTE — Telephone Encounter (Signed)
Spoke to the patient just now and she told me that she will be getting an injection at the lipid clinic tomorrow. I advised that it looked like the lipid clinic was doing their consult tomorrow regarding getting her started on one. She is upset because she thought we would give her the injections here in Dr. Joya Gaskins office. She also thought that this was the lipid clinic that she was speaking with. I advised her that during her visit tomorrow she will be provided with information and education on the injection and can get her questions answered there. I also advised her that I was unsure if she would receive the injection in the clinic as often times they are administered at home. She verbalizes understanding.

## 2021-03-06 NOTE — Telephone Encounter (Signed)
Pt just had an infusion of actimra and would like to make sure it will not interfere with her cholesterol injection.. please advise

## 2021-03-06 NOTE — Telephone Encounter (Signed)
Pt does not take a cholesterol injection according to her med list - need to clarify this with her.  The main cardiac side effects of Actemra include increase in cholesterol (~20% reported incidence), hypertension (6-7% incidence), DVT (3% incidence), and peripheral edema (2% incidence). It does not interact with her other medications though.

## 2021-03-07 ENCOUNTER — Ambulatory Visit (INDEPENDENT_AMBULATORY_CARE_PROVIDER_SITE_OTHER): Payer: Medicare PPO | Admitting: Pharmacist Clinician (PhC)/ Clinical Pharmacy Specialist

## 2021-03-07 ENCOUNTER — Other Ambulatory Visit: Payer: Self-pay

## 2021-03-07 DIAGNOSIS — E782 Mixed hyperlipidemia: Secondary | ICD-10-CM

## 2021-03-07 NOTE — Patient Instructions (Addendum)
Your Results:             Your most recent labs Goal  Total Cholesterol 246 < 200  Triglycerides 169 < 150  HDL (happy/good cholesterol) 61 > 40  LDL (lousy/bad cholesterol 152 < 70      Medication changes:  We will start the paperwork to see if Amanda Pham is covered by your insurance.  We will call you once we get that information and let you know what the cost would be.    If that is cost prohibitive, or you don't wish to try, we can consider Nexeltol (bempedoic acid), 180 mg once daily (oral tablet).   Lab orders:  Repeat labs after 2-3 months on either treatment.  Patient Assistance:  The [employer] foundation offers assistance to help pay for medication copays.  They will cover copays for all cholesterol lowering meds, including statins, fibrates, omega-3 oils, ezetimibe, Repatha, Praluent, Nexletol, Nexlizet.  The cards are usually good for $2,500 or 12 months, whichever comes first. Go to healthwellfoundation.org Click on Apply Now Answer questions as to whom is applying (patient or representative) Your disease fund will be hypercholesterolemia - Medicare access They will ask questions about finances and which medications you are taking for cholesterol When you submit, the approval is usually within minutes.  You will need to print the card information from the site You will need to show this information to your pharmacy, they will bill your Medicare Part D plan first -then bill [employer] --for the copay.   You can also call them at [phone], although the hold times can be quite long.   Thank you for choosing [facility]

## 2021-03-08 ENCOUNTER — Encounter: Payer: Self-pay | Admitting: Pharmacist Clinician (PhC)/ Clinical Pharmacy Specialist

## 2021-03-08 NOTE — Progress Notes (Signed)
03/08/2021 Amanda Pham [birth year] [id number]   HPI: Amanda Pham is an 81 y/o female with a history of CAD, essential hypertension, mixed hyperlipidemia, concussion, and TIA here today for lipid evaluation after a referral from Dr. Bettina Gavia. She had a left heart catheterization in 05/2017 which showed mild LAD 40-60% and marginal 20-30% stenosis. She is statin intolerant after experiencing myalgias with multiple statins and also PCSK9i intolerant after having a reaction to Praluent. Her rheumatologist was concerned if she continued using Praluent, her reaction would worsen. She was seen by Dr. Bettina Gavia on 01/25/2021 where he discussed Leqvio as being an option with her as her LDL direct on 01/22/2021 was 152 while being on Zetia and icosapent ethyl.   Said she was doing OK but does not understand why she cannot give Leqvio to herself at her home. Also is not sure if her insurance will even cover the [facility]. Otherwise, feeling good overall and is very happy to talk about her grandchildren and is very proud of them.   PMH: Hypertension - lisinopril-HCTZ, Toprol XL CAD - Toprol XL, lisinopril-HCTZ, nitroglycerin, ezetimibe  Hyperlipidemia - ezetimibe, icosapent ethyl (01/22/21 LDL 152) Rheumatoid arthritis - Actemra, hydroxychloroquine   LDL Goal < 70   Current Antihyperlipidemia Medications: Ezetimibe 10 mg  Icosapent ethyl 1 g - take 2 grams BID   Previous Antihyperlipidemia Medications: Statins - myalgias  PCSK9i - allergic reaction   Family Hx: Both mom and dad had high cholesterol; one sister sees a heart doctor, has high cholesterol on statin, diabetes; one son passed away from heart disease in 2019; one daughter has Afib; other son has had problems with heart racing; five grandsons, two granddaughters all healthy as far as she knows  Social Hx: No tobacco, maybe twice a year alcohol use, no coffee, hot tea decaf, no sodas  Diet: Chicken, [place], a little beef, lots of vegetables, fruits; eat  out some but not fast food Snack - peanut butter crackers, likes sweet tea Exercise:  Not able to do a lot of activity because of her RA, walks around the house, goes up and down the stairs, very active going to see friends and family  Intolerances: Iodine-131: swelling  Ivp Dye (Iodinated Contrast Media): anaphylaxis Pencillins: rash/unknown  Propylene glycol: dermatitis, nausea, vomiting Shellfish: rash, shortness of breath Codeine: nausea and vomiting Yellow IBP Dye: rash, dermatitis, raw skin, went into shock, started jerking on the table Statins: myalgias Repatha: redness and swelling Sulfonamide derivatives: nausea and vomiting Adhesive: rash Cephalexin: yeast infection Praluent: itching, rash, swelling  Labs: 01/22/2021: Na 140, K 3.5, BUN 19, SCr 1.21, eGFR 46, Glu 104 01/22/2021: TC 246, TG 169, HDL 61, LDL Direct 152   Current Outpatient Medications  Medication Sig Dispense Refill   citalopram (CELEXA) 20 MG tablet Take 20 mg by mouth daily.     cyanocobalamin 1000 MCG tablet Take 1 tablet by mouth daily.     cycloSPORINE (RESTASIS) 0.05 % ophthalmic emulsion Place 1 drop into both eyes 2 (two) times daily.     ezetimibe (ZETIA) 10 MG tablet TAKE 1 TABLET BY MOUTH EVERY DAY 90 tablet 1   fluconazole (DIFLUCAN) 200 MG tablet Take 1 tablet by mouth as needed.     hydroxychloroquine (PLAQUENIL) 200 MG tablet Take 200 mg by mouth daily.     hyoscyamine (ANASPAZ) 0.125 MG TBDP Place 0.125 mg under the tongue every 4 (four) hours as needed.     icosapent Ethyl (VASCEPA) 1 g capsule Take 2 capsules (2 g  total) by mouth 2 (two) times daily. 360 capsule 2   lisinopril-hydrochlorothiazide (PRINZIDE,ZESTORETIC) 20-25 MG per tablet TAKE 1/2 TABLET TWICE A DAY 90 tablet 1   metoprolol succinate (TOPROL-XL) 25 MG 24 hr tablet TAKE 1 TABLET BY MOUTH EVERY DAY 90 tablet 1   nitroGLYCERIN (NITROSTAT) 0.4 MG SL tablet Place 1 tablet (0.4 mg total) under the tongue as needed for chest  pain. 30 tablet 3   omeprazole (PRILOSEC) 40 MG capsule TAKE 1 CAPSULE (40 MG TOTAL) BY MOUTH DAILY. 30 capsule 5   tiZANidine (ZANAFLEX) 2 MG tablet Take 1 tablet by mouth as needed.     Tocilizumab (ACTEMRA IV) Inject into the vein. Once every 4 weeks     valACYclovir (VALTREX) 500 MG tablet TAKE 1 TABLET THREE TIMES A DAY AS NEEDED (Patient taking differently: TAKE 1 TABLET THREE TIMES A DAY AS NEEDED for cold sore) 30 tablet 2   Vitamin D, Ergocalciferol, (DRISDOL) 50000 UNITS CAPS capsule Take 1 capsule (50,000 Units total) by mouth every 7 (seven) days. 12 capsule 0   No current facility-administered medications for this visit.    Allergies  Allergen Reactions   Iodine-131 Swelling    Can use if Benadryl given pre-procedure   Ivp Dye [Iodinated Contrast Media] Anaphylaxis    "I bottomed out"   Penicillins Rash    "unknown" Patient reports her daughter got Stevens-Johnson Syndrome after taking AMOXIL She reports her whole immediate family, all her children and her husband's immediate family are allergic to PENICILLIN   Propylene Glycol Other (See Comments), Dermatitis and Nausea And Vomiting    Skin irritation topically. Derivatives in oral endo prep cause extreme projectile vomiting.   Shellfish-Derived Products Rash and Shortness Of Breath    Other reaction(s): GI Upset (intolerance)   Codeine Nausea And Vomiting   Other Other (See Comments), Rash and Dermatitis    "YELLOW IBP DYE"   WENT INTO SHOCK, STARTED JERKING ON THE TABLE. RAW SKIN   Statins     Other reaction(s): Arthralgias (intolerance), Myalgias (intolerance)   Repatha [Evolocumab]     Redness and swelling   Sulfonamide Derivatives Nausea And Vomiting   Adhesive [Tape] Rash   Cephalexin     Other reaction(s): Other (See Comments) Yeast infection   Praluent [Alirocumab] Itching and Rash    Pt called stated they had a reaction to praluent 75 size of 2 50cent pieces light pink itches. Pt stated that they saw a  rheumatologist and they are afraid that if she continues she will have a worse reaction    Past Medical History:  Diagnosis Date   Acute pain of right wrist 09/16/2017   Age-related osteoporosis without current pathological fracture 01/20/2016   reclast 2013, 2016.2017, 2021  Formatting of this note might be different from the original. 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 April 2019 catheterization: LAD 40-60%  Obtuse marginal 20-30%   Drug therapy 03/28/2015   Pt has tried Morrie Sheldon, Actemra stopped working, Leflunomide caused diarrhea and increased IBS symptoms, Plaquenil stopped working, Press photographer caused vaginal yeast infections, Remicade stopped working, Humira stopped working, and Sulfasalazine increased IBS symptoms.  Formatting of this note might be different from the original. Pt has tried Morrie Sheldon, Actemra stopped working, Leflunomide caused diarrhea   Essential hypertension 09/15/2006   Qualifier: Diagnosis of  By: Jimmye Norman, LPN, Winfield Cunas   Formatting of this note might be different from the original. Overview:  Qualifier: Diagnosis of  By: Jimmye Norman, LPN, Winfield Cunas   Fibromyalgia    GASTRIC POLYP 07/25/2009   Qualifier: Diagnosis of  By: Arnoldo Morale MD, Liliana Cline of this note might be different from the original. Overview:  Qualifier: Diagnosis of  By: Arnoldo Morale MD, John E   Gastroesophageal reflux disease  11/13/2006   Qualifier: Diagnosis of  By: Arnoldo Morale MD, Liliana Cline of this note might be different from the original. 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 Overview:  Qualifier: Diagnosis of  By: Arnoldo Morale MD, John E   Knee osteonecrosis [facility])    Mixed connective tissue disease ([place]) 05/19/2011   MUSCLE CRAMPS 05/15/2008   Qualifier: Diagnosis of  By: Arnoldo Morale MD, Balinda Quails    Osteoarthritis 10/15/2006   Qualifier: Diagnosis of  By: Arnoldo Morale MD, Liliana Cline of this note might be different from the original. Overview:  Qualifier: Diagnosis of  By: Arnoldo Morale MD, John E   Osteopenia    OSTEOPENIA 10/15/2006   Qualifier: Diagnosis of  By: Arnoldo Morale MD, John E    Pain in joints 03/28/2015   RECTAL FISSURE 08/31/2009   Qualifier: Diagnosis of  By: Jimmye Norman, LPN, Winfield Cunas    Seropositive rheumatoid arthritis ([place]) 10/15/2006   That this is confirmed seronegative rheumatoid arthritis.   Qualifier: Diagnosis of  By: Arnoldo Morale MD, Liliana Cline of this note might be different from the original. Overview:  Screening for CTD negative Working diagnosis sero negative RA  Last Assessment & Plan:   Patient has polyarticular arthritis we have given her a trial of Plaquenil to see if we could alleviate some of her arthritic pa   Sigmoid diverticulosis 01/26/2017   Unspecified deficiency anemia 10/30/2009   Qualifier: Diagnosis of  By: Arnoldo Morale MD, John E    Vertigo 08/20/2010    Blood pressure (!)  142/60, pulse 67, resp. rate 15, height 5' 4"  (1.626 m), weight 188 lb (85.3 kg), last menstrual period 02/11/1976, SpO2 98 %.   Hyperlipidemia Explained to the patient her options with Inclisiran and Nexlitol. Patient agreed to fill out the consent form for Inclisiran to see how much her insurance would cover  of it. We will reach out to her once we hear back from her insurance. If the Marion Downer is cost prohibitive for her, then we talked  about trying Nexletol which she was agreeable to. Informed her about the risk of gout and increased uric acid levels with Nexletol and if initiated, we would bring her back for labs and uric acid levels in 4 weeks from initiation. Patient is already on ezetimibe. If Nexletol works well for her, then will try to combine both medications with Nexlizet.    Tommy Medal PharmD CPP [facility] [address] [place], [place] [address] [phone]

## 2021-03-08 NOTE — Assessment & Plan Note (Signed)
Explained to the patient her options with Inclisiran and Nexlitol. Patient agreed to fill out the consent form for Inclisiran to see how much her insurance would cover  of it. We will reach out to her once we hear back from her insurance. If the Marion Downer is cost prohibitive for her, then we talked about trying Nexletol which she was agreeable to. Informed her about the risk of gout and increased uric acid levels with Nexletol and if initiated, we would bring her back for labs and uric acid levels in 4 weeks from initiation. Patient is already on ezetimibe. If Nexletol works well for her, then will try to combine both medications with Nexlizet.

## 2021-03-12 ENCOUNTER — Emergency Department (HOSPITAL_BASED_OUTPATIENT_CLINIC_OR_DEPARTMENT_OTHER): Payer: Medicare PPO

## 2021-03-12 ENCOUNTER — Encounter (HOSPITAL_BASED_OUTPATIENT_CLINIC_OR_DEPARTMENT_OTHER): Payer: Self-pay | Admitting: *Deleted

## 2021-03-12 ENCOUNTER — Other Ambulatory Visit: Payer: Self-pay

## 2021-03-12 ENCOUNTER — Emergency Department (HOSPITAL_BASED_OUTPATIENT_CLINIC_OR_DEPARTMENT_OTHER)
Admission: EM | Admit: 2021-03-12 | Discharge: 2021-03-12 | Disposition: A | Discharge status: Home / Self Care | Payer: Medicare PPO | Attending: Emergency Medicine | Admitting: Emergency Medicine

## 2021-03-12 DIAGNOSIS — J189 Pneumonia, unspecified organism: Secondary | ICD-10-CM | POA: Insufficient documentation

## 2021-03-12 DIAGNOSIS — Z20822 Contact with and (suspected) exposure to covid-19: Secondary | ICD-10-CM | POA: Insufficient documentation

## 2021-03-12 DIAGNOSIS — R059 Cough, unspecified: Secondary | ICD-10-CM | POA: Diagnosis present

## 2021-03-12 DIAGNOSIS — H9192 Unspecified hearing loss, left ear: Secondary | ICD-10-CM | POA: Diagnosis not present

## 2021-03-12 LAB — RESP PANEL BY RT-PCR (FLU A&B, COVID) ARPGX2
Influenza A by PCR: NEGATIVE
Influenza B by PCR: NEGATIVE
SARS Coronavirus 2 by RT PCR: NEGATIVE

## 2021-03-12 MED ORDER — AZITHROMYCIN 250 MG PO TABS: 250.0000 mg | ORAL_TABLET | Freq: Every day | ORAL | 0 refills | Status: AC

## 2021-03-12 NOTE — ED Triage Notes (Signed)
C/o URI x 6 days , pro cough  congestion , fatigue

## 2021-03-12 NOTE — ED Notes (Signed)
ED Provider at triage to eval pt

## 2021-03-12 NOTE — Discharge Instructions (Signed)
If you develop high fever, severe cough or cough with blood, trouble breathing, severe headache, neck pain/stiffness, vomiting, or any other new/concerning symptoms then return to the ER for evaluation  

## 2021-03-12 NOTE — ED Provider Notes (Signed)
[facility] EMERGENCY DEPARTMENT Provider Note   CSN: [id number] Arrival date & time: 03/12/21  1741     History  Chief Complaint  Patient presents with   URI    Amanda Pham is a 81 y.o. female.  HPI 81 year old female presents with cough.  She has been feeling poorly for about 6 days including congestion and nasal drip as well as a cough that has gone from yellow sputum to green.  However yesterday she developed some left ear discomfort and decreased hearing and then today she was having a feeling of shortness of breath and congestion in her chest.  Felt like she could not get a deep breath.  She had to prop her self up on a couple pillows.  No fevers during this time.   She has rheumatoid arthritis and is on Tocilizumab.  She states she is worried about pneumonia due to her immunosuppression. States her doctor normally gives her a z-pack.  She feels like she heard some wheezing earlier today.  Home Medications Prior to Admission medications   Medication Sig Start Date End Date Taking? Authorizing Provider  azithromycin (ZITHROMAX) 250 MG tablet Take 1 tablet (250 mg total) by mouth daily. Take first 2 tablets together, then 1 every day until finished. 03/12/21  Yes Sherwood Gambler, MD  citalopram (CELEXA) 20 MG tablet Take 20 mg by mouth daily. 02/12/18   [provider]  cyanocobalamin 1000 MCG tablet Take 1 tablet by mouth daily. 08/12/20   [provider]  cycloSPORINE (RESTASIS) 0.05 % ophthalmic emulsion Place 1 drop into both eyes 2 (two) times daily.    [provider]  ezetimibe (ZETIA) 10 MG tablet TAKE 1 TABLET BY MOUTH EVERY DAY 09/29/18   Richardo Priest, MD  fluconazole (DIFLUCAN) 200 MG tablet Take 1 tablet by mouth as needed. 01/29/21   [provider]  hydroxychloroquine (PLAQUENIL) 200 MG tablet Take 200 mg by mouth daily. 08/22/19   [provider]  hyoscyamine (ANASPAZ) 0.125 MG TBDP Place 0.125 mg under the  tongue every 4 (four) hours as needed.    [provider]  icosapent Ethyl (VASCEPA) 1 g capsule Take 2 capsules (2 g total) by mouth 2 (two) times daily. 02/12/21   Richardo Priest, MD  lisinopril-hydrochlorothiazide (PRINZIDE,ZESTORETIC) 20-25 MG per tablet TAKE 1/2 TABLET TWICE A DAY 01/10/14   Ricard Dillon, MD  metoprolol succinate (TOPROL-XL) 25 MG 24 hr tablet TAKE 1 TABLET BY MOUTH EVERY DAY 11/26/20   Richardo Priest, MD  nitroGLYCERIN (NITROSTAT) 0.4 MG SL tablet Place 1 tablet (0.4 mg total) under the tongue as needed for chest pain. 01/25/21   Richardo Priest, MD  omeprazole (PRILOSEC) 40 MG capsule TAKE 1 CAPSULE (40 MG TOTAL) BY MOUTH DAILY. 03/06/14   Ricard Dillon, MD  tiZANidine (ZANAFLEX) 2 MG tablet Take 1 tablet by mouth as needed. 10/11/20   [provider]  Tocilizumab (ACTEMRA IV) Inject into the vein. Once every 4 weeks    [provider]  valACYclovir (VALTREX) 500 MG tablet TAKE 1 TABLET THREE TIMES A DAY AS NEEDED Patient taking differently: TAKE 1 TABLET THREE TIMES A DAY AS NEEDED for cold sore 08/29/13   Ricard Dillon, MD  Vitamin D, Ergocalciferol, (DRISDOL) 50000 UNITS CAPS capsule Take 1 capsule (50,000 Units total) by mouth every 7 (seven) days. 10/27/14   Kem Boroughs, FNP      Allergies    Iodine-131, Ivp dye [iodinated contrast  media], Penicillins, Propylene glycol, Shellfish-derived products, Codeine, Other, Statins, Repatha [evolocumab], Sulfonamide derivatives, Adhesive [tape], Cephalexin, and Praluent [alirocumab]    Review of Systems   Review of Systems  Constitutional:  Negative for fever.  HENT:  Positive for congestion, ear pain and postnasal drip.   Respiratory:  Positive for cough and shortness of breath.   Cardiovascular:  Negative for chest pain.   Physical Exam Updated Vital Signs BP (!) 180/78 (BP Location: Right Arm)    Pulse 64    Temp 97.9 F (36.6 C) (Oral)    Resp 16    Ht 5\' 6"  (1.676 m)    Wt 81.6 kg     LMP 02/11/1976    SpO2 99%    BMI 29.05 kg/m  Physical Exam Vitals and nursing note reviewed.  Constitutional:      Appearance: She is well-developed.  HENT:     Head: Normocephalic and atraumatic.     Left Ear: Tympanic membrane normal.  Cardiovascular:     Rate and Rhythm: Normal rate and regular rhythm.     Heart sounds: Normal heart sounds.  Pulmonary:     Effort: Pulmonary effort is normal.     Breath sounds: Normal breath sounds. No wheezing, rhonchi or rales.  Abdominal:     General: There is no distension.  Skin:    General: Skin is warm and dry.  Neurological:     Mental Status: She is alert.    ED Results / Procedures / Treatments   Labs (all labs ordered are listed, but only abnormal results are displayed) Labs Reviewed  RESP PANEL BY RT-PCR (FLU A&B, COVID) [id number]    EKG None  Radiology DG Chest 2 View  Result Date: 03/12/2021 CLINICAL DATA:  Shortness of breath with cough. EXAM: CHEST - 2 VIEW COMPARISON:  Chest x-ray 08/23/2019. FINDINGS: The heart size and mediastinal contours are within normal limits. Both lungs are clear. The visualized skeletal structures are unremarkable. IMPRESSION: No active cardiopulmonary disease. Electronically Signed   By: Ronney Asters M.D.   On: 03/12/2021 18:20    Procedures Procedures    Medications Ordered in ED Medications - No data to display  ED Course/ Medical Decision Making/ A&P                           Medical Decision Making Amount and/or Complexity of Data Reviewed Radiology: ordered.  Risk Prescription drug management.   There is no obvious bacterial cause of her symptoms.  However with her having symptoms for several days and then getting a sudden worsening over the last 24 hours or less, I am concerned she might be developing a pneumonia on top of her URI.  Given her immunocompromise state, after discussion we decided to do a short course of azithromycin.  She is not hypoxic and has no increased work  of breathing and is not febrile.  I do not think further imaging or work-up is warranted.  Her COVID/flu testing is negative.  I have personally reviewed her chest x-ray images and there is no obvious pneumonia.  I hear no wheezing despite her saying she had some wheezing earlier.  I do not think steroids are indicated.  Will discharge home to follow-up with PCP.        Final Clinical Impression(s) / ED Diagnoses Final diagnoses:  Atypical pneumonia    Rx / DC Orders ED Discharge Orders  Ordered    azithromycin (ZITHROMAX) 250 MG tablet  Daily        03/12/21 1900              Sherwood Gambler, MD 03/12/21 1920

## 2021-03-18 ENCOUNTER — Other Ambulatory Visit: Payer: Self-pay

## 2021-03-18 ENCOUNTER — Telehealth: Payer: Self-pay

## 2021-03-18 DIAGNOSIS — E785 Hyperlipidemia, unspecified: Secondary | ICD-10-CM

## 2021-03-18 NOTE — Telephone Encounter (Signed)
Explained mechanism of Leqvio to patient.  Answered all questions.  She was concerned about potential liver damage, assured her that has not been seen in research, no warnings in the package insert inregards to liver issues.

## 2021-03-18 NOTE — Telephone Encounter (Signed)
Pt has questions on how leqvio works. Routing alvstad rph.  Your Leqvio injection is scheduled on 03/27/21 at 10am. Please arrive at [facility] and enter the hospital through [place] (the [employer]) that's located at [facility] 15 minutes before your scheduled injection time. Walk in to the registration desk and let them know that you are scheduled for an injection in the infusion center. They will register you and take you to your appointment.   Marion Downer is a subcutaneous injection that will lower your LDL cholesterol by 50%. If this is your first injection, your next injection will be due in 3 months. If this is NOT your first injection, your next injection will be due in 6 months. If you have not heard from [facility] to schedule your next appointment within 2 weeks of your next anticipated injection, please call [facility] to schedule this at:                [phone] - if you are seen at the [facility] : [facility] office               [phone] - if you are seen at the [facility] office

## 2021-03-27 ENCOUNTER — Encounter (HOSPITAL_COMMUNITY): Payer: Medicare PPO

## 2021-04-09 NOTE — Telephone Encounter (Signed)
Called and spoke to pt about getting scheduled for next leqvio injection and she told me to call her in 2 weeks.

## 2021-05-06 ENCOUNTER — Telehealth: Payer: Self-pay

## 2021-05-06 NOTE — Telephone Encounter (Signed)
Lmom the pt that we would be happy to schedule their next leqvio injection and to please call us back  ?

## 2021-05-08 NOTE — Telephone Encounter (Signed)
Called the pt to attempt scheduling their next leqvio injection and they stated that they were having a procedure done on their spine and wouldn't be able to reschedule and to try calling them back next week.  ?

## 2021-06-06 ENCOUNTER — Telehealth: Payer: Self-pay

## 2021-06-06 NOTE — Telephone Encounter (Signed)
Called to schedule the leqvio injection for the 3rd time and pt seemed hesitant to want to start the leqvio and stated she needed to consult pcp and would call us back when they are ready to start ?

## 2021-09-18 IMAGING — DX DG CHEST 2V
2 series · 2 of 2 positions shown · non-contrast
Comparison: 05/04/2017

CLINICAL DATA: Chest pain, shortness of breath

EXAM:
CHEST - 2 VIEW

[chest pa]
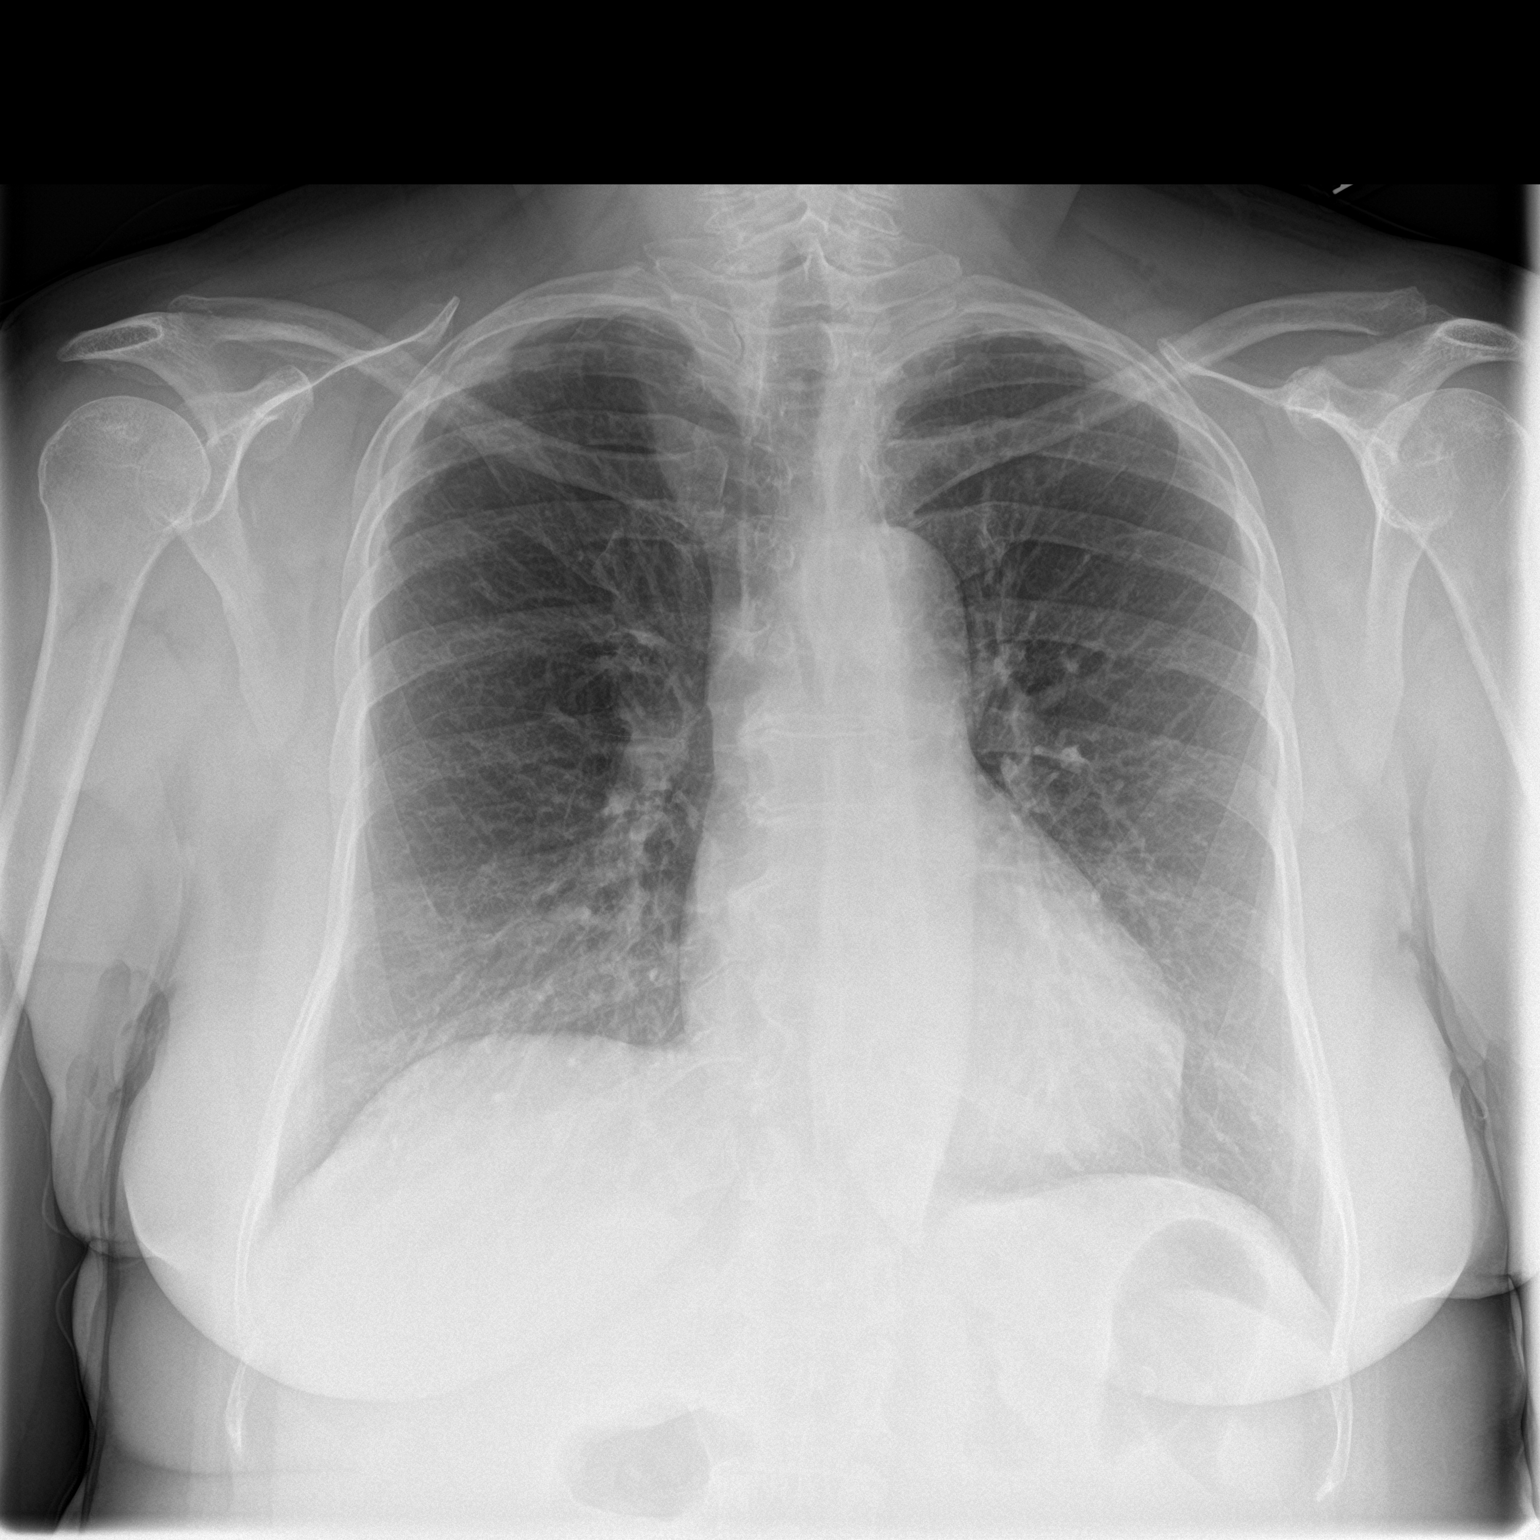

[chest lat]
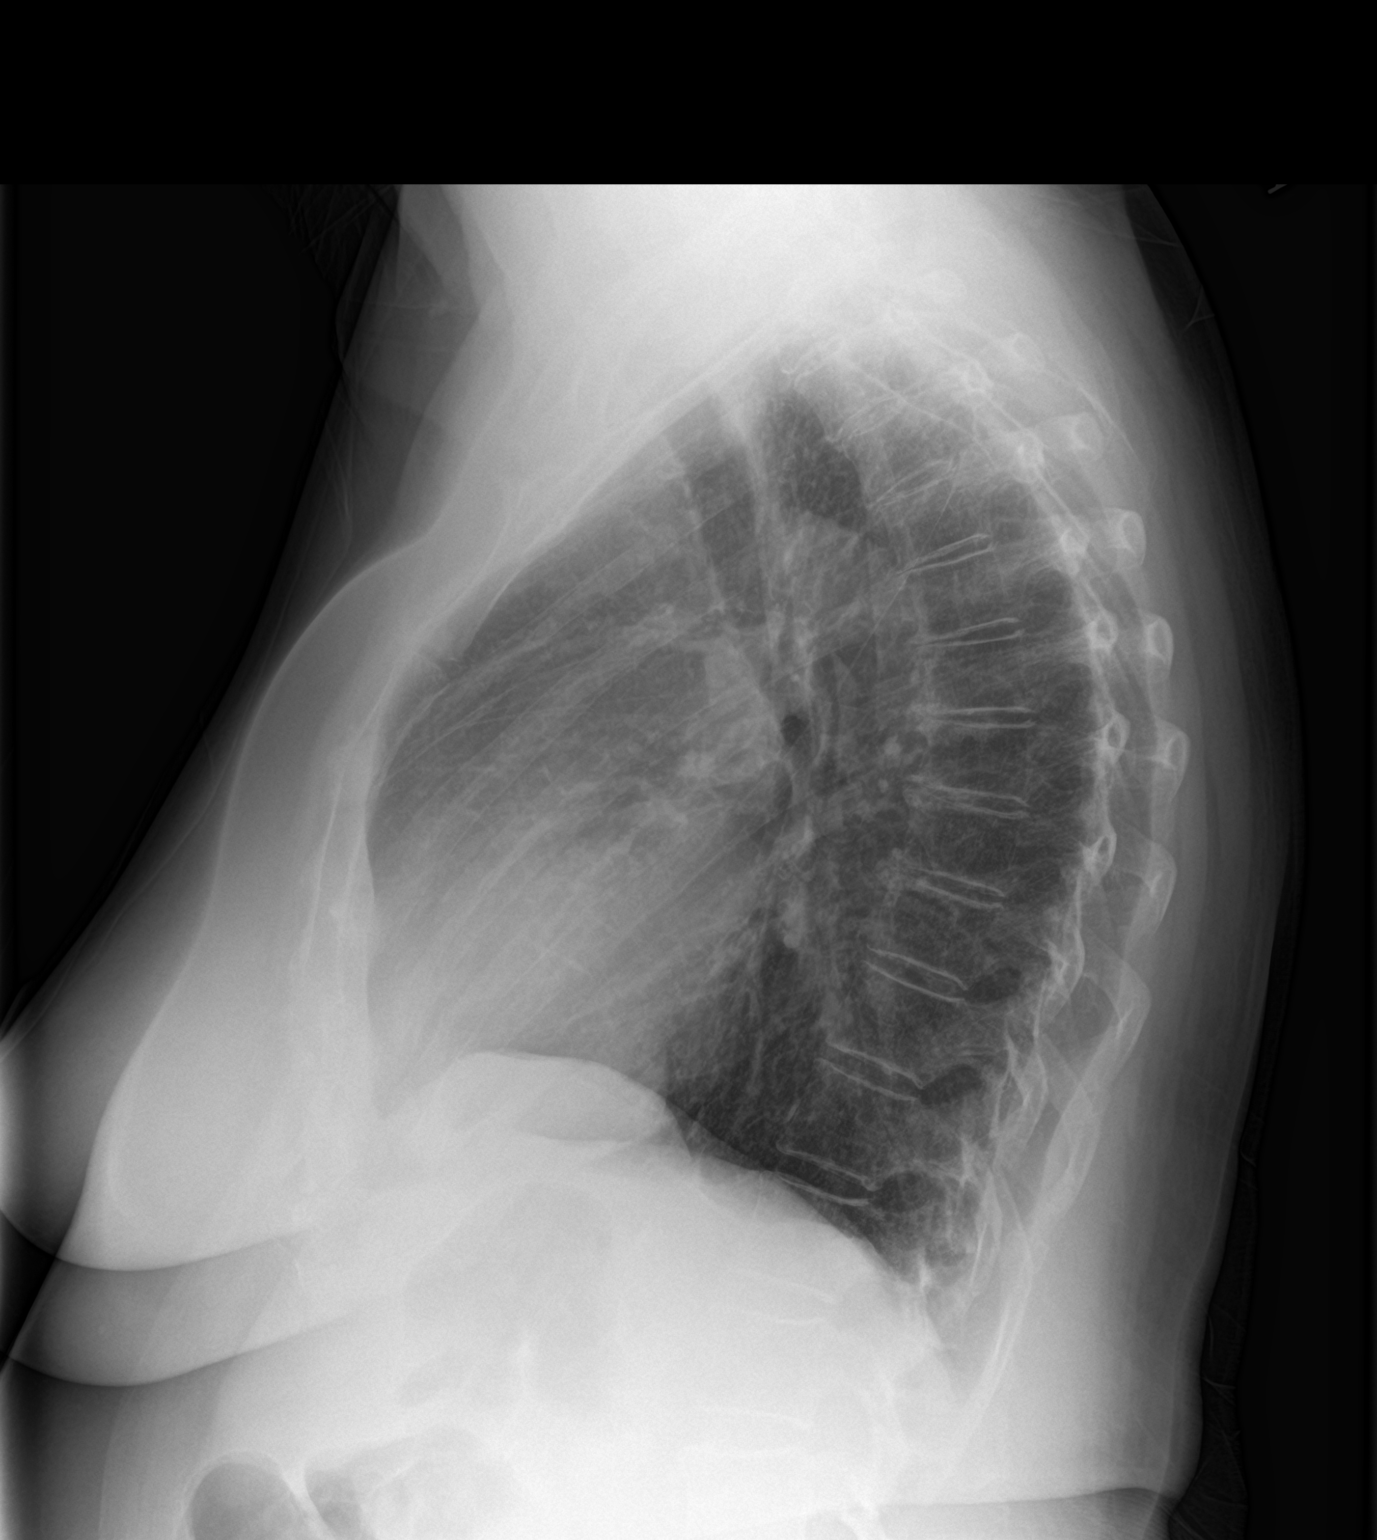

[2 of 2 positions shown; findings below may reference images not displayed]

FINDINGS: The heart size and mediastinal contours are within normal limits.
Both lungs are clear. The visualized skeletal structures are
unremarkable.
IMPRESSION: No active cardiopulmonary disease.

## 2021-09-18 IMAGING — NM NM PULMONARY PERF PARTICULATE
8 series · 8 of 8 positions shown · non-contrast
Comparison: None

Correlation: Chest radiograph 03/18/2019

CLINICAL DATA: Chest pain/discomfort for a few weeks, shortness of
breath for a few days, history hypertension, falls, elevated D-dimer

EXAM:
NUCLEAR MEDICINE PERFUSION LUNG SCAN
TECHNIQUE: Perfusion images were obtained in multiple projections after
intravenous injection of radiopharmaceutical.
Ventilation scans intentionally deferred if perfusion scan and chest
x-ray adequate for interpretation during COVID 19 epidemic.
RADIOPHARMACEUTICALS:  1.64 mCi 1c-TTm MAA IV

[Series 1: ant/post perf · 4.14mm/px · 1 of 1 slices shown (1 of 2)]
[im 1/1]
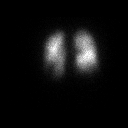

[Series 1: ant/post perf · 4.14mm/px · 1 of 1 slices shown (2 of 2)]
[im 1/1]
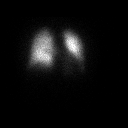

[Series 2: lao/rpo perf · 4.14mm/px · 1 of 1 slices shown (1 of 2)]
[im 1/1]
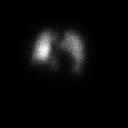

[Series 2: lao/rpo perf · 4.14mm/px · 1 of 1 slices shown (2 of 2)]
[im 1/1]
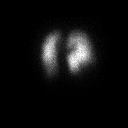

[Series 3: lpo/rao perf · 4.14mm/px · 1 of 1 slices shown (1 of 2)]
[im 1/1]
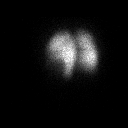

[Series 3: lpo/rao perf · 4.14mm/px · 1 of 1 slices shown (2 of 2)]
[im 1/1]
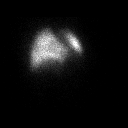

[Series 4: lt lat/rt lat perf · 4.14mm/px · 1 of 1 slices shown (1 of 2)]
[im 1/1]
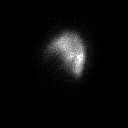

[Series 4: lt lat/rt lat perf · 4.14mm/px · 1 of 1 slices shown (2 of 2)]
[im 1/1]
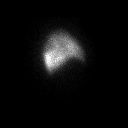

[8 of 8 positions shown; findings below may reference images not displayed]

FINDINGS: Segmental perfusion defect lateral basilar LEFT lower lobe.

Additional subsegmental perfusion defect superior segment RIGHT
lower lobe.

Remaining perfusion normal.

Chest radiograph demonstrates clear lungs.

Ventilation exam not performed, patient COVID status uncertain.
IMPRESSION: Segmental perfusion defect LEFT lower lobe with additional
subsegmental perfusion defect superior segment RIGHT lower lobe.

No corresponding radiographic findings.

In the absence of a ventilation study, the findings represent a low
to intermediate probability for pulmonary embolism.

## 2021-11-26 ENCOUNTER — Telehealth: Payer: Self-pay | Admitting: Cardiology

## 2021-11-26 NOTE — Telephone Encounter (Signed)
Since Dr. Bettina Gavia is not in [place] no more, the patient want to know what dr do he recommend that still in [place]. Please advise

## 2021-11-26 NOTE — Telephone Encounter (Signed)
Called pt back with Dr. Joya Gaskins reply. Pt agreed and verbalized understanding. She had no further questions.

## 2021-11-26 NOTE — Telephone Encounter (Signed)
Spoke with pt. Explained that she could come to [facility] or She could see Dr. Geraldo Pitter or Agustin Cree in [facility] if she chooses to stay with the group. She is not able to come to [facility] and wants you, personally to recommend a Cardiologist for her and her daughter.

## 2021-11-27 ENCOUNTER — Telehealth: Payer: Self-pay | Admitting: Cardiology

## 2021-11-27 NOTE — Telephone Encounter (Signed)
Pt c/o of Chest Pain: STAT if CP now or developed within 24 hours  1. Are you having CP right now? Yes, but not bad  2. Are you experiencing any other symptoms (ex. SOB, nausea, vomiting, sweating)? Nausea, SOB, but not currently  3. How long have you been experiencing CP? About 2 weeks  4. Is your CP continuous or coming and going? Comes and goes, but lasts a while  5. Have you taken Nitroglycerin? Yes, gives her a pounding headache ?   Patient states she has been having chest pain and pain going down her arm. She says she has a history of spasms and thought it was that. She says it has been getting worse. She says she will take nitroglycerin, but it comes back soon. She says recently she walked a short distance and was very SOB.

## 2021-11-27 NOTE — Telephone Encounter (Signed)
Pt is requesting to see someone in [place] as the [facility] office is limited to hours. Pt lives closer to [place]. Requesting and appointment with Dr. Harriet Masson.

## 2021-12-17 ENCOUNTER — Ambulatory Visit: Payer: Medicare PPO | Admitting: Cardiology

## 2022-02-05 ENCOUNTER — Ambulatory Visit: Payer: Medicare PPO

## 2022-05-26 ENCOUNTER — Encounter: Payer: Self-pay | Admitting: *Deleted
